# Patient Record
Sex: Male | Born: 1970 | Race: White | Hispanic: No | Marital: Married | Smoking: Never smoker
Health system: Southern US, Community
[De-identification: ages and names within clinical notes are randomized; demographics above are authoritative.]

## PROBLEM LIST (undated history)

## (undated) ENCOUNTER — Ambulatory Visit: Admission: EM | Payer: Self-pay | Source: Home / Self Care

## (undated) DIAGNOSIS — C801 Malignant (primary) neoplasm, unspecified: Secondary | ICD-10-CM

## (undated) DIAGNOSIS — I82890 Acute embolism and thrombosis of other specified veins: Secondary | ICD-10-CM

## (undated) DIAGNOSIS — G4733 Obstructive sleep apnea (adult) (pediatric): Secondary | ICD-10-CM

## (undated) DIAGNOSIS — N400 Enlarged prostate without lower urinary tract symptoms: Secondary | ICD-10-CM

## (undated) DIAGNOSIS — E291 Testicular hypofunction: Secondary | ICD-10-CM

## (undated) HISTORY — DX: Testicular hypofunction: E29.1

## (undated) HISTORY — DX: Benign prostatic hyperplasia without lower urinary tract symptoms: N40.0

## (undated) HISTORY — DX: Malignant (primary) neoplasm, unspecified: C80.1

## (undated) HISTORY — PX: TENOTOMY ACHILLES TENDON: SUR1337

## (undated) HISTORY — DX: Obstructive sleep apnea (adult) (pediatric): G47.33

---

## 2003-02-10 ENCOUNTER — Emergency Department (HOSPITAL_COMMUNITY): Admission: EM | Admit: 2003-02-10 | Discharge: 2003-02-10 | Payer: Self-pay | Admitting: Emergency Medicine

## 2012-01-23 DIAGNOSIS — G4733 Obstructive sleep apnea (adult) (pediatric): Secondary | ICD-10-CM

## 2012-01-23 HISTORY — PX: NASAL SEPTUM SURGERY: SHX37

## 2012-01-23 HISTORY — PX: HIP SURGERY: SHX245

## 2012-01-23 HISTORY — DX: Obstructive sleep apnea (adult) (pediatric): G47.33

## 2012-02-23 LAB — VITAMIN D 25 HYDROXY (VIT D DEFICIENCY, FRACTURES): Vit D, 25-Hydroxy: 21

## 2012-02-23 LAB — COMPREHENSIVE METABOLIC PANEL
ALT: 29
AST: 37 U/L
Alkaline Phosphatase: 54 U/L
BILIRUBIN: 1.1
BUN: 18 mg/dL (ref 4–21)
CREATININE: 0.98
Glucose: 92
Potassium: 4.3 mmol/L
SODIUM: 137 mmol/L (ref 137–147)

## 2012-02-23 LAB — CBC
HGB: 17 g/dL
PLATELET COUNT: 204
WBC: 7.1

## 2012-02-23 LAB — TSH+FREE T4
FREE T4: 0.9
TSH: 0.86

## 2012-02-23 LAB — LIPID PANEL
CHOLESTEROL, TOTAL: 146
HDL: 39 mg/dL (ref 35–70)
LDL (calc): 97
TRIGLYCERIDES: 52

## 2012-02-23 LAB — PSA: PSA: 0.59

## 2012-02-23 LAB — TESTOSTERONE: Testosterone: 186

## 2014-04-22 ENCOUNTER — Ambulatory Visit (INDEPENDENT_AMBULATORY_CARE_PROVIDER_SITE_OTHER): Payer: BLUE CROSS/BLUE SHIELD | Admitting: Family Medicine

## 2014-04-22 ENCOUNTER — Encounter: Payer: Self-pay | Admitting: Family Medicine

## 2014-04-22 VITALS — BP 110/76 | HR 48 | Temp 97.5°F | Ht 69.25 in | Wt 203.4 lb

## 2014-04-22 DIAGNOSIS — J342 Deviated nasal septum: Secondary | ICD-10-CM | POA: Insufficient documentation

## 2014-04-22 DIAGNOSIS — R413 Other amnesia: Secondary | ICD-10-CM

## 2014-04-22 DIAGNOSIS — R0789 Other chest pain: Secondary | ICD-10-CM | POA: Insufficient documentation

## 2014-04-22 DIAGNOSIS — G4733 Obstructive sleep apnea (adult) (pediatric): Secondary | ICD-10-CM

## 2014-04-22 DIAGNOSIS — R5382 Chronic fatigue, unspecified: Secondary | ICD-10-CM

## 2014-04-22 DIAGNOSIS — E291 Testicular hypofunction: Secondary | ICD-10-CM | POA: Insufficient documentation

## 2014-04-22 DIAGNOSIS — K625 Hemorrhage of anus and rectum: Secondary | ICD-10-CM | POA: Insufficient documentation

## 2014-04-22 DIAGNOSIS — N400 Enlarged prostate without lower urinary tract symptoms: Secondary | ICD-10-CM | POA: Insufficient documentation

## 2014-04-22 DIAGNOSIS — E663 Overweight: Secondary | ICD-10-CM | POA: Insufficient documentation

## 2014-04-22 HISTORY — DX: Testicular hypofunction: E29.1

## 2014-04-22 NOTE — Assessment & Plan Note (Signed)
H/o this in last month - will need further evaluation when returns for CPE. Has never had colonoscopy.

## 2014-04-22 NOTE — Assessment & Plan Note (Signed)
Check B12 and TSH today.

## 2014-04-22 NOTE — Assessment & Plan Note (Addendum)
Has f/u scheduled with Dr Annabell HowellsWrenn urology. Will request records from prior PCP

## 2014-04-22 NOTE — Assessment & Plan Note (Signed)
Overall atypical chest pain - not exertional.  Will check modifiable risk factors including glu and FLP. Check TnI today as well.  If persistent pain, consider referral for stress testing. No significant fmhx CAD  EKG today - NSR rate 60s, normal axis, intervals, no acute ST/T changes.

## 2014-04-22 NOTE — Assessment & Plan Note (Signed)
Sleep study positive but has not undergone treatment.

## 2014-04-22 NOTE — Assessment & Plan Note (Signed)
Motivated to increase exercise in regimen for goal sustainable weight loss

## 2014-04-22 NOTE — Progress Notes (Signed)
BP 110/76 mmHg  Pulse 48  Temp(Src) 97.5 F (36.4 C) (Oral)  Ht 5' 9.25" (1.759 m)  Wt 203 lb 6.4 oz (92.262 kg)  BMI 29.82 kg/m2   CC: new pt to establish  Subjective:    Patient ID: Adrian Moreno, male    DOB: [birth year], 44 y.o.   MRN: [id number]  HPI: Adrian Moreno is a 44 y.o. male presenting on 04/22/2014 for Establish Care   Prior saw Dr Leanora Ivanoff in [place], [place].   BPH (has seen Dr Annabell Howells) - on flomax daily as well as doxycycline  bid for I presume prostatitis. Has f/u with urology 05/03/2014.  Rectal bleeding with wiping - 1 mo ago, lasted a few days. No h/o hemorrhoids. No pain with defecation. Occasionally gets perianal rash and stays raw/irritated. Using towelettes to wipe.   Worsening memory recently - more trouble with short term recently noted. Some trouble with focus (missing exits).   Chest pain/tightness in the past month - several episodes. Come on at rest. Go away after a few min with resting and deep breathing. No dyspnea or nausea. No regular exercise. Wants to restart. Currently with mild chest pressure sensation. Intermittent GERD. Denies anxiety issues. No reproducible chest pain.   Hypogonadism - Testosterone low at 140 in the past. Has f/u with urology scheduled for this.   Nasal septal sinus surgery 2014 - done to treat OSA/snoring. Needed 2 surgeries on same day, rushed back for second surgery, needed cauterization of bleed. Since then, needs to use nasal saline daily, finds he easily retains fluid in nose. Feels there's a "shelf" superior nasal passage bilaterally. Snoring returning. Requests referral to ENT for second opinion/re evaluation.   OSA - could not tolerate CPAP so had repair of deviated septum. Has not had f/u sleep eval since then. Snoring has recurred.   Preventative: Due for CPE  Lives with wife, 1 dog Edu: some college Occupation: Retail banker at [employer] Activity: no regular exercise currently Diet: good water,  fruits/vegetables daily  Relevant past medical, surgical, family and social history reviewed and updated as indicated. Interim medical history since our last visit reviewed. Allergies and medications reviewed and updated. No current outpatient prescriptions on file prior to visit.   No current facility-administered medications on file prior to visit.    Review of Systems Per HPI unless specifically indicated above     Objective:    BP 110/76 mmHg  Pulse 48  Temp(Src) 97.5 F (36.4 C) (Oral)  Ht 5' 9.25" (1.759 m)  Wt 203 lb 6.4 oz (92.262 kg)  BMI 29.82 kg/m2  Wt Readings from Last 3 Encounters:  04/22/14 203 lb 6.4 oz (92.262 kg)    Physical Exam  Constitutional: He is oriented to person, place, and time. He appears well-developed and well-nourished. No distress.  HENT:  Head: Normocephalic and atraumatic.  Right Ear: Hearing, tympanic membrane, external ear and ear canal normal.  Left Ear: Hearing, tympanic membrane, external ear and ear canal normal.  Nose: Nose normal. No mucosal edema or rhinorrhea. Right sinus exhibits no maxillary sinus tenderness and no frontal sinus tenderness. Left sinus exhibits no maxillary sinus tenderness and no frontal sinus tenderness.  Mouth/Throat: Uvula is midline, oropharynx is clear and moist and mucous membranes are normal. No oropharyngeal exudate, posterior oropharyngeal edema, posterior oropharyngeal erythema or tonsillar abscesses.  Eyes: Conjunctivae and EOM are normal. Pupils are equal, round, and reactive to light. No scleral icterus.  Neck: Normal range of motion. Neck supple. No thyromegaly  present.  Cardiovascular: Normal rate, regular rhythm, normal heart sounds and intact distal pulses.   No murmur heard. Pulmonary/Chest: Effort normal and breath sounds normal. No respiratory distress. He has no wheezes. He has no rales. He exhibits no tenderness.  No reproducible chest pain to palpation  Musculoskeletal: He exhibits no edema.    Lymphadenopathy:    He has no cervical adenopathy.  Neurological: He is alert and oriented to person, place, and time.  Skin: Skin is warm and dry. No rash noted.  Psychiatric: He has a normal mood and affect.  Nursing note and vitals reviewed.  No results found for this or any previous visit.    Assessment & Plan:   Problem List Items Addressed This Visit    Rectal bleeding    H/o this in last month - will need further evaluation when returns for CPE. Has never had colonoscopy.      Overweight    Motivated to increase exercise in regimen for goal sustainable weight loss      OSA (obstructive sleep apnea)    Sleep study positive but has not undergone treatment.      Nasal septal deviation    Reports h/o deviated septum repair followed by nasal hemorrhage needing surgical revision and cauterization. Since surgery pt has noted difficulty with retention of fluid (ie nasal saline) and persistent nasal sinus congestion - worse than prior to surgery. Requests ENT referral for second opinion.      Memory loss    Check B12 and TSH today.      Relevant Orders   Vitamin B12   Hypogonadism in male    Has f/u scheduled with Dr Annabell HowellsWrenn urology. Will request records from prior PCP      Chest pressure - Primary    Overall atypical chest pain - not exertional.  Will check modifiable risk factors including glu and FLP. Check TnI today as well.  If persistent pain, consider referral for stress testing. No significant fmhx CAD  EKG today - NSR rate 60s, normal axis, intervals, no acute ST/T changes.      Relevant Orders   Lipid panel   Comprehensive metabolic panel   TSH   CBC with Differential/Platelet   EKG 12-Lead (Completed)   Troponin I   BPH (benign prostatic hypertrophy)    Has established with Dr Annabell HowellsWrenn urology. Currently on flomax and doxycycline - for presumed prostatitis.      Relevant Medications   tamsulosin (FLOMAX) 0.4 MG CAPS capsule    Other Visit Diagnoses     Chronic fatigue            Follow up plan: Return as needed, for CPE.

## 2014-04-22 NOTE — Assessment & Plan Note (Signed)
Has established with Dr Annabell HowellsWrenn urology. Currently on flomax and doxycycline - for presumed prostatitis.

## 2014-04-22 NOTE — Assessment & Plan Note (Signed)
Reports h/o deviated septum repair followed by nasal hemorrhage needing surgical revision and cauterization. Since surgery pt has noted difficulty with retention of fluid (ie nasal saline) and persistent nasal sinus congestion - worse than prior to surgery. Requests ENT referral for second opinion.

## 2014-04-22 NOTE — Patient Instructions (Addendum)
Labwork today.  EKG today.  Good to meet you today. Return at your convenience for physical.  We will refer you to ENT - we will call you in next few weeks to set this up. Have Dr Annabell HowellsWrenn send us note at next visit.

## 2014-04-23 LAB — CBC WITH DIFFERENTIAL/PLATELET
Basophils Absolute: 0 K/uL (ref 0.0–0.1)
Basophils Relative: 0.6 % (ref 0.0–3.0)
Eosinophils Absolute: 0.2 K/uL (ref 0.0–0.7)
Eosinophils Relative: 2.4 % (ref 0.0–5.0)
HCT: 48.7 % (ref 39.0–52.0)
Hemoglobin: 16.6 g/dL (ref 13.0–17.0)
Lymphocytes Relative: 42.8 % (ref 12.0–46.0)
Lymphs Abs: 3.1 K/uL (ref 0.7–4.0)
MCHC: 34.1 g/dL (ref 30.0–36.0)
MCV: 89.3 fl (ref 78.0–100.0)
Monocytes Absolute: 0.5 K/uL (ref 0.1–1.0)
Monocytes Relative: 6.3 % (ref 3.0–12.0)
Neutro Abs: 3.5 K/uL (ref 1.4–7.7)
Neutrophils Relative %: 47.9 % (ref 43.0–77.0)
Platelets: 176 K/uL (ref 150.0–400.0)
RBC: 5.46 Mil/uL (ref 4.22–5.81)
RDW: 14.3 % (ref 11.5–15.5)
WBC: 7.3 K/uL (ref 4.0–10.5)

## 2014-04-23 LAB — TROPONIN I: Troponin I: <0.01 ng/mL

## 2014-04-23 LAB — COMPREHENSIVE METABOLIC PANEL WITH GFR
ALT: 29 U/L (ref 0–53)
AST: 22 U/L (ref 0–37)
Albumin: 4.4 g/dL (ref 3.5–5.2)
Alkaline Phosphatase: 56 U/L (ref 39–117)
BUN: 12 mg/dL (ref 6–23)
CO2: 31 meq/L (ref 19–32)
Calcium: 10 mg/dL (ref 8.4–10.5)
Chloride: 103 meq/L (ref 96–112)
Creatinine, Ser: 0.92 mg/dL (ref 0.40–1.50)
GFR: 94.64 mL/min
Glucose, Bld: 84 mg/dL (ref 70–99)
Potassium: 4.2 meq/L (ref 3.5–5.1)
Sodium: 138 meq/L (ref 135–145)
Total Bilirubin: 1 mg/dL (ref 0.2–1.2)
Total Protein: 7.1 g/dL (ref 6.0–8.3)

## 2014-04-23 LAB — LIPID PANEL
CHOLESTEROL: 174 mg/dL (ref 0–200)
HDL: 43.4 mg/dL (ref >39.00)
LDL CALC: 95 mg/dL (ref 0–99)
NonHDL: 130.6
TRIGLYCERIDES: 176 mg/dL — AB (ref 0.0–149.0)
Total CHOL/HDL Ratio: 4
VLDL: 35.2 mg/dL (ref 0.0–40.0)

## 2014-04-23 LAB — VITAMIN B12: Vitamin B-12: 262 pg/mL (ref 211–911)

## 2014-04-23 LAB — TSH: TSH: 1.39 u[IU]/mL (ref 0.35–4.50)

## 2014-04-25 ENCOUNTER — Other Ambulatory Visit: Payer: Self-pay | Admitting: Family Medicine

## 2014-04-25 DIAGNOSIS — E538 Deficiency of other specified B group vitamins: Secondary | ICD-10-CM | POA: Insufficient documentation

## 2014-04-25 MED ORDER — VITAMIN B-12 1000 MCG PO TABS: 1000.0000 ug | ORAL_TABLET | Freq: Every day | ORAL | Status: DC

## 2014-04-26 ENCOUNTER — Encounter: Payer: Self-pay | Admitting: *Deleted

## 2014-05-07 ENCOUNTER — Encounter: Payer: Self-pay | Admitting: Family Medicine

## 2014-05-17 ENCOUNTER — Encounter: Payer: Self-pay | Admitting: *Deleted

## 2014-05-23 HISTORY — PX: COLONOSCOPY: SHX174

## 2014-06-03 LAB — HM COLONOSCOPY

## 2014-06-04 ENCOUNTER — Encounter: Payer: Self-pay | Admitting: Family Medicine

## 2014-07-08 ENCOUNTER — Encounter: Payer: Self-pay | Admitting: Family Medicine

## 2014-07-08 ENCOUNTER — Ambulatory Visit (INDEPENDENT_AMBULATORY_CARE_PROVIDER_SITE_OTHER): Payer: BLUE CROSS/BLUE SHIELD | Admitting: Family Medicine

## 2014-07-08 VITALS — BP 122/78 | HR 84 | Temp 98.2°F | Ht 69.25 in | Wt 204.5 lb

## 2014-07-08 DIAGNOSIS — E538 Deficiency of other specified B group vitamins: Secondary | ICD-10-CM | POA: Diagnosis not present

## 2014-07-08 DIAGNOSIS — S76311A Strain of muscle, fascia and tendon of the posterior muscle group at thigh level, right thigh, initial encounter: Secondary | ICD-10-CM

## 2014-07-08 DIAGNOSIS — R0789 Other chest pain: Secondary | ICD-10-CM | POA: Diagnosis not present

## 2014-07-08 DIAGNOSIS — E291 Testicular hypofunction: Secondary | ICD-10-CM | POA: Diagnosis not present

## 2014-07-08 DIAGNOSIS — Z0000 Encounter for general adult medical examination without abnormal findings: Secondary | ICD-10-CM | POA: Insufficient documentation

## 2014-07-08 DIAGNOSIS — S76319A Strain of muscle, fascia and tendon of the posterior muscle group at thigh level, unspecified thigh, initial encounter: Secondary | ICD-10-CM | POA: Insufficient documentation

## 2014-07-08 NOTE — Assessment & Plan Note (Addendum)
Anticipate anxiety related - but given persistent sxs will refer to cardiology to help r/o cardiac cause, consider ETT. Pt agrees.

## 2014-07-08 NOTE — Assessment & Plan Note (Signed)
Has established with urology - on testosterone injections. Will refer to card for clearance prior to continued increased testosterone dose.

## 2014-07-08 NOTE — Assessment & Plan Note (Signed)
Anticipate hamstring strain - treat supportively with stretching exercises provided today as well as ace bandage for compression. Advised caution with increased activity too quickly.

## 2014-07-08 NOTE — Patient Instructions (Addendum)
We will refer you to heart doctor for further evaluation of chest pain and likely treadmill stress test. Return as needed or in 1 year for next physical. Sign release for records from Dr Annabell Howells. For hamstring - likely strain. Treat with exercises provided and use ace bandage for compression wrap for thigh especially if you are active.

## 2014-07-08 NOTE — Assessment & Plan Note (Signed)
Preventative protocols reviewed and updated unless pt declined. Discussed healthy diet and lifestyle.  

## 2014-07-08 NOTE — Progress Notes (Signed)
BP 122/78 mmHg  Pulse 84  Temp(Src) 98.2 F (36.8 C) (Oral)  Ht 5' 9.25" (1.759 m)  Wt 204 lb 8 oz (92.761 kg)  BMI 29.98 kg/m2   CC: CPE  Subjective:    Patient ID: Adrian Moreno, male    DOB: 1970-12-26, 44 y.o.   MRN: [id number]  HPI: Adrian Moreno is a 44 y.o. male presenting on 07/08/2014 for Annual Exam   Urologist Dr Annabell Howells - started on testosterone without significant improvement. Stable prostate exam. ?BPH in the past. Did not like flomax.  R hamstring tightness over last month. Persistent discomfort. Has treated with   See prior note for details. Persistent intermittent chest pains improved with deep breathing. Discomfort described as tightness. + mild dyspnea. No nausea or jaw/L arm pain. Attributes to anxiety. Sedentary. Saturday going mtn bike riding. Recent weight gain. Finds alcohol completely relieves pain.  Margarita drinks cause severe heartburn.  Preventative: Colonoscopy 05/2014 - int hemorrhoids otherwise normal (Dr Madilyn Fireman) Flu - declines Tetanus ~2012 Seat belt use discussed Sunscreen use discussed. No changing moles on skin  Lives with wife, 1 dog Edu: some college Occupation: Retail banker at [employer] Activity: no regular exercise currently  Diet: good water, fruits/vegetables daily, avoiding fast foods  Relevant past medical, surgical, family and social history reviewed and updated as indicated. Interim medical history since our last visit reviewed. Allergies and medications reviewed and updated. Current Outpatient Prescriptions on File Prior to Visit  Medication Sig  . vitamin B-12 (CYANOCOBALAMIN) 1000 MCG tablet Take 1 tablet (1,000 mcg total) by mouth daily.   No current facility-administered medications on file prior to visit.    Review of Systems  Constitutional: Negative for fever, chills, activity change, appetite change, fatigue and unexpected weight change.  HENT: Negative for hearing loss.   Eyes: Negative for visual disturbance.    Respiratory: Negative for cough, chest tightness, shortness of breath and wheezing.   Cardiovascular: Positive for chest pain (intermittent, related to stress/nerves). Negative for palpitations and leg swelling.  Gastrointestinal: Negative for nausea, vomiting, abdominal pain, diarrhea, constipation, blood in stool and abdominal distention.  Genitourinary: Negative for hematuria and difficulty urinating.  Musculoskeletal: Negative for myalgias, arthralgias and neck pain.  Skin: Negative for rash.  Neurological: Negative for dizziness, seizures, syncope and headaches.  Hematological: Negative for adenopathy. Does not bruise/bleed easily.  Psychiatric/Behavioral: Negative for dysphoric mood. The patient is nervous/anxious.    Per HPI unless specifically indicated above     Objective:    BP 122/78 mmHg  Pulse 84  Temp(Src) 98.2 F (36.8 C) (Oral)  Ht 5' 9.25" (1.759 m)  Wt 204 lb 8 oz (92.761 kg)  BMI 29.98 kg/m2  Wt Readings from Last 3 Encounters:  07/08/14 204 lb 8 oz (92.761 kg)  04/22/14 203 lb 6.4 oz (92.262 kg)    Physical Exam  Constitutional: He is oriented to person, place, and time. He appears well-developed and well-nourished. No distress.  HENT:  Head: Normocephalic and atraumatic.  Right Ear: Hearing, tympanic membrane, external ear and ear canal normal.  Left Ear: Hearing, tympanic membrane, external ear and ear canal normal.  Nose: Nose normal.  Mouth/Throat: Uvula is midline, oropharynx is clear and moist and mucous membranes are normal. No oropharyngeal exudate, posterior oropharyngeal edema or posterior oropharyngeal erythema.  Eyes: Conjunctivae and EOM are normal. Pupils are equal, round, and reactive to light. No scleral icterus.  Neck: Normal range of motion. Neck supple. No thyromegaly present.  Cardiovascular: Normal rate, regular rhythm,  normal heart sounds and intact distal pulses.   No murmur heard. Pulses:      Radial pulses are 2+ on the right side,  and 2+ on the left side.  Pulmonary/Chest: Effort normal and breath sounds normal. No respiratory distress. He has no wheezes. He has no rales. He exhibits no tenderness.  Abdominal: Soft. Bowel sounds are normal. He exhibits no distension and no mass. There is no tenderness. There is no rebound and no guarding.  Musculoskeletal: Normal range of motion. He exhibits no edema.  Lymphadenopathy:    He has no cervical adenopathy.  Neurological: He is alert and oriented to person, place, and time.  CN grossly intact, station and gait intact  Skin: Skin is warm and dry. No rash noted.  Psychiatric: He has a normal mood and affect. His behavior is normal. Judgment and thought content normal.  Nursing note and vitals reviewed.  Results for orders placed or performed in visit on 06/04/14  HM COLONOSCOPY  Result Value Ref Range   HM Colonoscopy int hem, o/w WNL       Assessment & Plan:   Problem List Items Addressed This Visit    Chest pressure    Anticipate anxiety related - but given persistent sxs will refer to cardiology to help r/o cardiac cause, consider ETT. Pt agrees.      Relevant Orders   Ambulatory referral to Cardiology   Hamstring strain    Anticipate hamstring strain - treat supportively with stretching exercises provided today as well as ace bandage for compression. Advised caution with increased activity too quickly.      Health maintenance examination - Primary    Preventative protocols reviewed and updated unless pt declined. Discussed healthy diet and lifestyle.       Hypogonadism in male    Has established with urology - on testosterone injections. Will refer to card for clearance prior to continued increased testosterone dose.      Low vitamin B12 level    Pt compliant with 1000 mcg daily OTC          Follow up plan: Return in about 1 year (around 07/08/2015), or as needed, for annual exam, prior fasting for blood work.

## 2014-07-08 NOTE — Progress Notes (Signed)
Pre visit review using our clinic review tool, if applicable. No additional management support is needed unless otherwise documented below in the visit note. 

## 2014-07-08 NOTE — Assessment & Plan Note (Signed)
Pt compliant with 1000 mcg daily OTC

## 2014-08-12 ENCOUNTER — Ambulatory Visit: Payer: BLUE CROSS/BLUE SHIELD | Admitting: Cardiovascular Disease

## 2014-08-27 ENCOUNTER — Ambulatory Visit: Payer: BLUE CROSS/BLUE SHIELD | Admitting: Cardiovascular Disease

## 2014-09-17 ENCOUNTER — Encounter: Payer: Self-pay | Admitting: Podiatry

## 2014-09-17 ENCOUNTER — Ambulatory Visit (INDEPENDENT_AMBULATORY_CARE_PROVIDER_SITE_OTHER): Payer: BLUE CROSS/BLUE SHIELD

## 2014-09-17 ENCOUNTER — Ambulatory Visit (INDEPENDENT_AMBULATORY_CARE_PROVIDER_SITE_OTHER): Payer: BLUE CROSS/BLUE SHIELD | Admitting: Podiatry

## 2014-09-17 VITALS — BP 120/76 | HR 83 | Resp 16 | Ht 70.0 in | Wt 200.0 lb

## 2014-09-17 DIAGNOSIS — M722 Plantar fascial fibromatosis: Secondary | ICD-10-CM | POA: Diagnosis not present

## 2014-09-17 NOTE — Progress Notes (Signed)
   Subjective:    Patient ID: Adrian Moreno, male    DOB: August 23, 1970, 44 y.o.   MRN: [id number]  HPI Comments: "My heels have been killing me"  Patient presents with bilateral foot pain in the left fourth met right. He states he's had pain for last 2-3 months and his been intermittent in nature but progressing. He states he is taking ibuprofen which seems to help some. He has pain in the morning or after periods of inactivity which is relieved by ambulation. He states the area feels tight. Denies any recent injury or trauma. No tingling or numbness. No swelling or redness. The pain does not wake him up at night. No other complaints at this time.      Review of Systems  All other systems reviewed and are negative.      Objective:   Physical Exam AAO x3, NAD DP/PT pulses palpable bilaterally, CRT less than 3 seconds Protective sensation intact with Simms Weinstein monofilament, vibratory sensation intact, Achilles tendon reflex intact Tenderness to palpation overlying the plantar medial tubercle of the calcaneus to bilateral heel at the insertion of the plantar fascia with the left > right. There is no pain along the course of plantar fascial within the arch of the foot. There is no pain with lateral compression of the calcaneus or pain the vibratory sensation. No pain on the posterior aspect of the calcaneus or along the course/insertion of the Achilles tendon. There is no overlying edema, erythema, increase in warmth. No other areas of tenderness palpation or pain with vibratory sensation to the foot/ankle. MMT 5/5, ROM WNL No open lesions or pre-ulcerative lesions are identified. No pain with calf compression, swelling, warmth, erythema.      Assessment & Plan:  44 year old male with bilateral heel pain left greater than right. -Treatment options discussed including all alternatives, risks, and complications -X-rays were obtained and reviewed with the patient.  Patient elects to  proceed with steroid injection into the bilateral heel. Under sterile skin preparation, a total of 2.5cc of kenalog 10, 0.5% Marcaine plain, and 2% lidocaine plain were infiltrated into the symptomatic area without complication. A band-aid was applied. Patient tolerated the injection well without complication. Post-injection care with discussed with the patient. Discussed with the patient to ice the area over the next couple of days to help prevent a steroid flare.  -Fascial brace dispensed. -Stretching exercises daily ice daily. -Shoe gear modifications. -Discussed orthotics. -Follow-up 3-4 weeks or sooner if any problems arise. In the meantime, encouraged to call the office with any questions, concerns, change in symptoms.   Celesta Gentile, DPM

## 2014-09-17 NOTE — Patient Instructions (Signed)

## 2014-10-08 ENCOUNTER — Ambulatory Visit: Payer: BLUE CROSS/BLUE SHIELD | Admitting: Podiatry

## 2014-10-08 ENCOUNTER — Ambulatory Visit (INDEPENDENT_AMBULATORY_CARE_PROVIDER_SITE_OTHER): Payer: BLUE CROSS/BLUE SHIELD | Admitting: Podiatry

## 2014-10-08 ENCOUNTER — Encounter: Payer: Self-pay | Admitting: Podiatry

## 2014-10-08 ENCOUNTER — Telehealth: Payer: Self-pay | Admitting: *Deleted

## 2014-10-08 VITALS — BP 100/75 | HR 81 | Resp 18

## 2014-10-08 DIAGNOSIS — M722 Plantar fascial fibromatosis: Secondary | ICD-10-CM

## 2014-10-08 MED ORDER — METHYLPREDNISOLONE 4 MG PO TBPK: ORAL_TABLET | ORAL | Status: DC

## 2014-10-08 NOTE — Patient Instructions (Signed)
Plantar Fasciitis (Heel Spur Syndrome) with Rehab The plantar fascia is a fibrous, ligament-like, soft-tissue structure that spans the bottom of the foot. Plantar fasciitis is a condition that causes pain in the foot due to inflammation of the tissue. SYMPTOMS   Pain and tenderness on the underneath side of the foot.  Pain that worsens with standing or walking. CAUSES  Plantar fasciitis is caused by irritation and injury to the plantar fascia on the underneath side of the foot. Common mechanisms of injury include:  Direct trauma to bottom of the foot.  Damage to a small nerve that runs under the foot where the main fascia attaches to the heel bone.  Stress placed on the plantar fascia due to bone spurs. RISK INCREASES WITH:   Activities that place stress on the plantar fascia (running, jumping, pivoting, or cutting).  Poor strength and flexibility.  Improperly fitted shoes.  Tight calf muscles.  Flat feet.  Failure to warm-up properly before activity.  Obesity. PREVENTION  Warm up and stretch properly before activity.  Allow for adequate recovery between workouts.  Maintain physical fitness:  Strength, flexibility, and endurance.  Cardiovascular fitness.  Maintain a health body weight.  Avoid stress on the plantar fascia.  Wear properly fitted shoes, including arch supports for individuals who have flat feet. PROGNOSIS  If treated properly, then the symptoms of plantar fasciitis usually resolve without surgery. However, occasionally surgery is necessary. RELATED COMPLICATIONS   Recurrent symptoms that may result in a chronic condition.  Problems of the lower back that are caused by compensating for the injury, such as limping.  Pain or weakness of the foot during push-off following surgery.  Chronic inflammation, scarring, and partial or complete fascia tear, occurring more often from repeated injections. TREATMENT  Treatment initially involves the use of  ice and medication to help reduce pain and inflammation. The use of strengthening and stretching exercises may help reduce pain with activity, especially stretches of the Achilles tendon. These exercises may be performed at home or with a therapist. Your caregiver may recommend that you use heel cups of arch supports to help reduce stress on the plantar fascia. Occasionally, corticosteroid injections are given to reduce inflammation. If symptoms persist for greater than 6 months despite non-surgical (conservative), then surgery may be recommended.  MEDICATION   If pain medication is necessary, then nonsteroidal anti-inflammatory medications, such as aspirin and ibuprofen, or other minor pain relievers, such as acetaminophen, are often recommended.  Do not take pain medication within 7 days before surgery.  Prescription pain relievers may be given if deemed necessary by your caregiver. Use only as directed and only as much as you need.  Corticosteroid injections may be given by your caregiver. These injections should be reserved for the most serious cases, because they may only be given a certain number of times. HEAT AND COLD  Cold treatment (icing) relieves pain and reduces inflammation. Cold treatment should be applied for 10 to 15 minutes every 2 to 3 hours for inflammation and pain and immediately after any activity that aggravates your symptoms. Use ice packs or massage the area with a piece of ice (ice massage).  Heat treatment may be used prior to performing the stretching and strengthening activities prescribed by your caregiver, physical therapist, or athletic trainer. Use a heat pack or soak the injury in warm water. SEEK IMMEDIATE MEDICAL CARE IF:  Treatment seems to offer no benefit, or the condition worsens.  Any medications produce adverse side effects. EXERCISES RANGE   OF MOTION (ROM) AND STRETCHING EXERCISES - Plantar Fasciitis (Heel Spur Syndrome) These exercises may help you  when beginning to rehabilitate your injury. Your symptoms may resolve with or without further involvement from your physician, physical therapist or athletic trainer. While completing these exercises, remember:   Restoring tissue flexibility helps normal motion to return to the joints. This allows healthier, less painful movement and activity.  An effective stretch should be held for at least 30 seconds.  A stretch should never be painful. You should only feel a gentle lengthening or release in the stretched tissue. RANGE OF MOTION - Toe Extension, Flexion  Sit with your right / left leg crossed over your opposite knee.  Grasp your toes and gently pull them back toward the top of your foot. You should feel a stretch on the bottom of your toes and/or foot.  Hold this stretch for __________ seconds.  Now, gently pull your toes toward the bottom of your foot. You should feel a stretch on the top of your toes and or foot.  Hold this stretch for __________ seconds. Repeat __________ times. Complete this stretch __________ times per day.  RANGE OF MOTION - Ankle Dorsiflexion, Active Assisted  Remove shoes and sit on a chair that is preferably not on a carpeted surface.  Place right / left foot under knee. Extend your opposite leg for support.  Keeping your heel down, slide your right / left foot back toward the chair until you feel a stretch at your ankle or calf. If you do not feel a stretch, slide your bottom forward to the edge of the chair, while still keeping your heel down.  Hold this stretch for __________ seconds. Repeat __________ times. Complete this stretch __________ times per day.  STRETCH - Gastroc, Standing  Place hands on wall.  Extend right / left leg, keeping the front knee somewhat bent.  Slightly point your toes inward on your back foot.  Keeping your right / left heel on the floor and your knee straight, shift your weight toward the wall, not allowing your back to  arch.  You should feel a gentle stretch in the right / left calf. Hold this position for __________ seconds. Repeat __________ times. Complete this stretch __________ times per day. STRETCH - Soleus, Standing  Place hands on wall.  Extend right / left leg, keeping the other knee somewhat bent.  Slightly point your toes inward on your back foot.  Keep your right / left heel on the floor, bend your back knee, and slightly shift your weight over the back leg so that you feel a gentle stretch deep in your back calf.  Hold this position for __________ seconds. Repeat __________ times. Complete this stretch __________ times per day. STRETCH - Gastrocsoleus, Standing  Note: This exercise can place a lot of stress on your foot and ankle. Please complete this exercise only if specifically instructed by your caregiver.   Place the ball of your right / left foot on a step, keeping your other foot firmly on the same step.  Hold on to the wall or a rail for balance.  Slowly lift your other foot, allowing your body weight to press your heel down over the edge of the step.  You should feel a stretch in your right / left calf.  Hold this position for __________ seconds.  Repeat this exercise with a slight bend in your right / left knee. Repeat __________ times. Complete this stretch __________ times per day.    STRENGTHENING EXERCISES - Plantar Fasciitis (Heel Spur Syndrome)  These exercises may help you when beginning to rehabilitate your injury. They may resolve your symptoms with or without further involvement from your physician, physical therapist or athletic trainer. While completing these exercises, remember:   Muscles can gain both the endurance and the strength needed for everyday activities through controlled exercises.  Complete these exercises as instructed by your physician, physical therapist or athletic trainer. Progress the resistance and repetitions only as guided. STRENGTH -  Towel Curls  Sit in a chair positioned on a non-carpeted surface.  Place your foot on a towel, keeping your heel on the floor.  Pull the towel toward your heel by only curling your toes. Keep your heel on the floor.  If instructed by your physician, physical therapist or athletic trainer, add ____________________ at the end of the towel. Repeat __________ times. Complete this exercise __________ times per day. STRENGTH - Ankle Inversion  Secure one end of a rubber exercise band/tubing to a fixed object (table, pole). Loop the other end around your foot just before your toes.  Place your fists between your knees. This will focus your strengthening at your ankle.  Slowly, pull your big toe up and in, making sure the band/tubing is positioned to resist the entire motion.  Hold this position for __________ seconds.  Have your muscles resist the band/tubing as it slowly pulls your foot back to the starting position. Repeat __________ times. Complete this exercises __________ times per day.  Document Released: 01/08/2005 Document Revised: 04/02/2011 Document Reviewed: 04/22/2008 [employer] Patient Information 2015 [employer]. This information is not intended to replace advice given to you by your health care provider. Make sure you discuss any questions you have with your health care provider.  

## 2014-10-08 NOTE — Telephone Encounter (Addendum)
MRI left foot without contrast faxed to [facility].  [employer] Authorization# [id number], valid to 11/09/2014.  Faxed.

## 2014-10-11 ENCOUNTER — Encounter: Payer: Self-pay | Admitting: Podiatry

## 2014-10-11 NOTE — Progress Notes (Signed)
Patient ID: Adrian Moreno, male   DOB: 17-Oct-1970, 44 y.o.   MRN: [id number]  Subjective: 44 year old male presents the office today for follow-up evaluation of bilateral heel pain. He states that since last upon the left foot is worsening. He does that he gets some relief after the injections however after couple weeks but did not last he started to have recurrence of pain. He states the left foot is are sent for tissue and weightbearing. He also has pain in the morning he first gets up. Denies any swelling or redness. No tingling or numbness. There is been no recent injury or trauma. He is also continue the stretching, icing. He states the plantar fascial brace did not help. No other complaints at this time in no acute changes. He denies any systemic complaints as fevers, chills, nausea, vomiting. No calf pain, chest pain, shortness of breath.  Objective: AAO x3, NAD DP/PT pulses palpable bilaterally, CRT less than 3 seconds Protective sensation intact with Simms Weinstein monofilament, vibratory sensation intact, Achilles tendon reflex intact; negative Tinel sign. There is continued tenderness to palpation overlying the plantar medial tubercle of the calcaneus to both the left and right heel at the insertion of the plantar fascia with the left more significant than the right. There is no pain along the course of plantar fascial within the arch of the foot. The plantar fasciitis appears to be intact. There is no pain with lateral compression of the calcaneus or pain the vibratory sensation. No pain on the posterior aspect of the calcaneus or along the course/insertion of the Achilles tendon. There is no overlying edema, erythema, increase in warmth. No other areas of tenderness palpation or pain with vibratory sensation to the foot/ankle. No open lesions or pre-ulcerative lesions are identified. No pain with calf compression, swelling, warmth, erythema.  Assessment: 44 year old male with continued  bilateral heel pain left greater than right.  Plan: -Treatment options discussed including all alternatives, risks, and complications -Patient elects to proceed with steroid injection into the bilateral heels. Under sterile skin preparation, a total of 2.5cc of kenalog 10, 0.5% Marcaine plain, and 2% lidocaine plain were infiltrated into the symptomatic area without complication. A band-aid was applied. Patient tolerated the injection well without complication. Post-injection care with discussed with the patient. Discussed with the patient to ice the area over the next couple of days to help prevent a steroid flare.  -Continue icing, stretching exercises daily. Continue supportive shoe gear and orthotics were discussed. -Due to the increased pain to the left chills last appointment concern for plantar fascial tear. Because of this an MRI was ordered of the left foot. -Prescription for physical therapy was also provided today. -Follow-up in 3 weeks or sooner if any problems arise. In the meantime, encouraged to call the office with any questions, concerns, change in symptoms.   Ovid Curd, DPM

## 2014-10-18 ENCOUNTER — Other Ambulatory Visit: Payer: BLUE CROSS/BLUE SHIELD

## 2014-10-29 ENCOUNTER — Encounter: Payer: Self-pay | Admitting: Podiatry

## 2014-10-29 ENCOUNTER — Ambulatory Visit (INDEPENDENT_AMBULATORY_CARE_PROVIDER_SITE_OTHER): Payer: BLUE CROSS/BLUE SHIELD | Admitting: Podiatry

## 2014-10-29 VITALS — BP 106/58 | HR 75 | Resp 18

## 2014-10-29 DIAGNOSIS — M722 Plantar fascial fibromatosis: Secondary | ICD-10-CM

## 2014-10-29 MED ORDER — MELOXICAM 7.5 MG PO TABS: 7.5000 mg | ORAL_TABLET | Freq: Every day | ORAL | Status: DC

## 2014-10-29 NOTE — Patient Instructions (Signed)

## 2014-11-01 ENCOUNTER — Encounter: Payer: Self-pay | Admitting: Podiatry

## 2014-11-01 NOTE — Progress Notes (Signed)
Patient ID: Adrian Moreno, male   DOB: Oct 24, 1970, 44 y.o.   MRN: [id number]  Subjective:  44 year old male presents the office they for follow-up evaluation of bilateral heel pain. He states the right side is almost 100% better. He states the left pain has decreased. He no longer has a first step pain in the morning and the left side although he does continue to have pain to the heel with weightbearing and pressure for prolonged. A time. He states that the removable plantar fascial pads which are dispensed last appointment helped significantly. He states that almost immediately he had resolution of symptoms after wearing these pads. He is continuing stretching icing activities as well. He was able to get the MRI due to insurance. He did not follow-up with physical therapy. No other complaints at the time in no acute changes otherwise. He denies any systemic complaints such as fevers, chills, nausea, vomiting. No calf pain, chest pain, SOB.  Objective: AAO x3, NAD DP/PT pulses palpable bilaterally, CRT less than 3 seconds Protective sensation intact with Simms Weinstein monofilament, vibratory sensation intact, Achilles tendon reflex intact There is mild continued tenderness to palpation overlying the plantar medial tubercle of the calcaneus to the left heel heel at the insertion of the plantar fascia. There is no significant pain in the right heel. There is no pain along the course of plantar fascia within the arch of the foot. The plantar fascia appears to be intact. There is no pain with lateral compression of the calcaneus or pain the vibratory sensation. No pain on the posterior aspect of the calcaneus or along the course/insertion of the Achilles tendon. There is no overlying edema, erythema, increase in warmth. No other areas of tenderness palpation or pain with vibratory sensation to the foot/ankle. MMT 5/5, ROM WNL No open lesions or pre-ulcerative lesions are identified. No pain with calf  compression, swelling, warmth, erythema.  Assessment: 44 year old male with continuation left heel pain with resolving right heel pain.  Plan: -Treatment options discussed including all alternatives, risks, and complications -Prescribed mobic. Discussed side effects of the medication and directed to stop if any are to occur and call the office.  -Continue icing and stretching exercises daily. -New plantar fascial pads were applied/dispensed. -I do recommend custom orthotics. States appointment he was scanned for orthotics never sent to [facility]. -Continue supportive shoe gear. -Discussed steroid injection to the left heel however he wishes to hold off on that this time. -Follow-up once orthotics arrive or sooner if any problems are to arise. In the meantime call the office with any questions, concerns, change in symptoms.  Ovid Curd, DPM

## 2014-11-19 ENCOUNTER — Ambulatory Visit (INDEPENDENT_AMBULATORY_CARE_PROVIDER_SITE_OTHER): Payer: BLUE CROSS/BLUE SHIELD | Admitting: Podiatry

## 2014-11-19 ENCOUNTER — Encounter: Payer: Self-pay | Admitting: Podiatry

## 2014-11-19 VITALS — BP 160/100 | HR 69 | Resp 12

## 2014-11-19 DIAGNOSIS — M722 Plantar fascial fibromatosis: Secondary | ICD-10-CM | POA: Diagnosis not present

## 2014-11-22 ENCOUNTER — Telehealth: Payer: Self-pay | Admitting: *Deleted

## 2014-11-22 ENCOUNTER — Encounter: Payer: Self-pay | Admitting: Podiatry

## 2014-11-22 DIAGNOSIS — M722 Plantar fascial fibromatosis: Secondary | ICD-10-CM

## 2014-11-22 NOTE — Progress Notes (Signed)
Patient ID: Dena BilletJeffrey Balbi, male   DOB: 06/12/1970, 44 y.o.   MRN: [id number]  Subjective: 44 year old male presents the office today for follow-up evaluation of bilateral heel pain. He states the right heel pain is completely resolved. He does continue to have pain to the left side which is limiting his daily activities. Her last appointment he was doing well. He feels that he has taken a step backwards since last appointment to the left side. He denies any recent injury or trauma to the area. He believes that this pain and left-sided man started after he underwent left hip surgery last year and he was on this foot for quite some time he has become more active. He denies any numbness or tingling. He denies any swelling or redness. The pain does not wake him up at night. He continues his stretching, icing exercises daily. He is continue supportive shoe gear. He is also taking up orthotics today. No other complaints at this time no acute changes.  Objective: AAO 3, NAD DP/PT pulses palpable 2/4, CRT less than 3 seconds Protective sensation intact with Simms Weinstein monofilament  There is tenderness to palpation on the plantar medial tubercle of the calcaneus at the insertion the plantar fascia on the left side. There is mild discomfort on the medial band of the plantar fascial in the arch of the foot. Plantar fascia appears to be grossly intact. There is no pain with lateral compression of the calcaneus or pain the vibratory sensation. No pain along the course the Achilles tendon. Equinus is present. No other areas of tenderness to bilateral lower extremities. There is no overlying edema, erythema, increase in warmth. No open lesions or pre-ulcerative lesions. No pain with calf compression, swelling, warmth, erythema.  Objective: 44 year old male presents with bilateral heel pain left worse than right  Plan: -Treatment options discussed including all alternatives, risks, and complications -Patient  elects to proceed with steroid injection into the left heel. Under sterile skin preparation, a total of 2.5cc of kenalog 10, 0.5% Marcaine plain, and 2% lidocaine plain were infiltrated into the symptomatic area without complication. A band-aid was applied. Patient tolerated the injection well without complication. Post-injection care with discussed with the patient. Discussed with the patient to ice the area over the next couple of days to help prevent a steroid flare.  -At today's appointment orthotics were dispensed. They appear to be fitting well. Break-in instructions both oral and written were provided. -Continue ice and stretching exercises daily. -I discussed physical therapy however due to time he did not go. -He would like to pursue the MRI the left side at this time. I ordered 1 previously however he declined it as she was doing better. I will reorder this today. -Follow-up after MRI or sooner if any problems arise. In the meantime, encouraged to call the office with any questions, concerns, change in symptoms.   Ovid CurdMatthew Arushi Partridge, DPM

## 2014-11-22 NOTE — Telephone Encounter (Addendum)
At 11/19/2014 pt states he has now met his insurance deductible and would like to get the MRI of his left foot.  Dr. Jacqualyn Posey ordered MRI left foot plantar fasciitis chronic.  Prior Authorization [employer] denied, must have DR. Wagoner contact reviewer at [phone] to provide additional clinical information.  Dr. Leigh Aurora PRIOR AUTHORIZATION # [id number] valid to 12/22/2014.  Faxed to [place] [phone].

## 2014-11-23 NOTE — Telephone Encounter (Signed)
Authorization #[id number] valid until 12/22/14.

## 2014-11-26 ENCOUNTER — Ambulatory Visit (INDEPENDENT_AMBULATORY_CARE_PROVIDER_SITE_OTHER): Payer: BLUE CROSS/BLUE SHIELD | Admitting: Urology

## 2014-11-26 DIAGNOSIS — E291 Testicular hypofunction: Secondary | ICD-10-CM

## 2014-11-26 DIAGNOSIS — R351 Nocturia: Secondary | ICD-10-CM | POA: Diagnosis not present

## 2014-11-26 DIAGNOSIS — N401 Enlarged prostate with lower urinary tract symptoms: Secondary | ICD-10-CM

## 2014-11-26 DIAGNOSIS — D751 Secondary polycythemia: Secondary | ICD-10-CM | POA: Diagnosis not present

## 2014-11-30 ENCOUNTER — Ambulatory Visit
Admission: RE | Admit: 2014-11-30 | Discharge: 2014-11-30 | Disposition: A | Discharge status: Home / Self Care | Payer: BLUE CROSS/BLUE SHIELD | Source: Ambulatory Visit | Attending: Podiatry | Admitting: Podiatry

## 2014-11-30 DIAGNOSIS — M722 Plantar fascial fibromatosis: Secondary | ICD-10-CM

## 2014-12-09 ENCOUNTER — Telehealth: Payer: Self-pay | Admitting: *Deleted

## 2014-12-09 DIAGNOSIS — M722 Plantar fascial fibromatosis: Secondary | ICD-10-CM | POA: Diagnosis not present

## 2014-12-09 NOTE — Telephone Encounter (Signed)
Called patient on Tuesday Dec 03, 2014 and left a message for the patient to call me back to go over the MRI of his left heel. Patient called back and I stated to the patient what Dr Ardelle AntonWagoner had seen on the report and patient states he is still hurting and per Dr Ardelle AntonWagoner could fit him in an AFW and [facility] [place] [facility] to get a boot and to be there around 4:15 and the patient agreed. Misty StanleyLisa

## 2014-12-09 NOTE — Telephone Encounter (Signed)
Pt presents to be fitted and instructed on the air fracture walker wear.  Pt is fitted and instructed on the application of the AFW and told to be in the AFW as much as possible without sleeping or showering in.  Pt states understanding.

## 2015-01-23 DIAGNOSIS — I82890 Acute embolism and thrombosis of other specified veins: Secondary | ICD-10-CM

## 2015-01-23 HISTORY — DX: Acute embolism and thrombosis of other specified veins: I82.890

## 2015-01-31 ENCOUNTER — Encounter: Payer: Self-pay | Admitting: Family Medicine

## 2015-01-31 ENCOUNTER — Encounter: Payer: BLUE CROSS/BLUE SHIELD | Admitting: Family Medicine

## 2015-02-16 ENCOUNTER — Emergency Department
Admission: EM | Admit: 2015-02-16 | Discharge: 2015-02-16 | Disposition: A | Discharge status: Home / Self Care | Payer: BLUE CROSS/BLUE SHIELD | Attending: Student | Admitting: Student

## 2015-02-16 ENCOUNTER — Encounter: Payer: Self-pay | Admitting: *Deleted

## 2015-02-16 DIAGNOSIS — Z79899 Other long term (current) drug therapy: Secondary | ICD-10-CM | POA: Insufficient documentation

## 2015-02-16 DIAGNOSIS — I82812 Embolism and thrombosis of superficial veins of left lower extremities: Secondary | ICD-10-CM | POA: Diagnosis not present

## 2015-02-16 DIAGNOSIS — M79605 Pain in left leg: Secondary | ICD-10-CM | POA: Diagnosis present

## 2015-02-16 DIAGNOSIS — I82890 Acute embolism and thrombosis of other specified veins: Secondary | ICD-10-CM

## 2015-02-16 HISTORY — DX: Acute embolism and thrombosis of other specified veins: I82.890

## 2015-02-16 MED ORDER — MELOXICAM 15 MG PO TABS: 15.0000 mg | ORAL_TABLET | Freq: Every day | ORAL | Status: DC

## 2015-02-16 MED ORDER — OMEPRAZOLE 10 MG PO CPDR: 10.0000 mg | DELAYED_RELEASE_CAPSULE | Freq: Every day | ORAL | Status: DC

## 2015-02-16 NOTE — ED Notes (Signed)
Pt has a blood clot in left lower leg.  Pt had surgery on left lower leg on December 27.  Pt is wearing a boot on left lower leg.  Pt sent to er for an eval of dvt.

## 2015-02-16 NOTE — ED Provider Notes (Signed)
[facility] Emergency Department Provider Note  ____________________________________________  Time seen: Approximately 9:28 PM  I have reviewed the triage vital signs and the nursing notes.   HISTORY  Chief Complaint DVT    HPI Adrian Moreno is a 45 y.o. male who presents emergency department complaining of left leg pain. Patient had recent surgery for stretching of the Achilles tendon. Patient has been in a walking boot and has had decreased mobility. Patient returned to his surgeon today with a complaint of left lower extremity swelling and pain to the left lateral calf. Patient was sent for an ultrasound which revealed no evidence of deep vein thrombosis but did show thrombus in the posterior branch of the posterior tibial vein. Patient was sent from ultrasound to the emergency department by his provider for further evaluation and treatment. Patient denies any systemic complaints to include chest pain, shortness of breath, headache, visual acuity changes.   Past Medical History  Diagnosis Date  . BPH (benign prostatic hypertrophy)     [place]  . OSA (obstructive sleep apnea) 2014    did not tolerate CPAP  . Hypogonadism in male 04/22/2014    Patient Active Problem List   Diagnosis Date Noted  . Health maintenance examination 07/08/2014  . Hamstring strain 07/08/2014  . Low vitamin B12 level 04/25/2014  . Chest pressure 04/22/2014  . OSA (obstructive sleep apnea) 04/22/2014  . 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 Hypogonadism in male 04/22/2014  . Overweight 04/22/2014  . BPH (benign prostatic hypertrophy) 04/22/2014  . Memory loss 04/22/2014    Past Surgical History  Procedure Laterality Date  . Nasal septum surgery  2014    s/p surgery x2 on same day with complications  . Hip surgery Left 2014    torn labrum  . Colonoscopy  05/2014    int hem, rpt 10 yrs Madilyn Fireman)    Current Outpatient Rx  Name  Route  Sig  Dispense  Refill  . meloxicam  (MOBIC) 15 MG tablet   Oral   Take 1 tablet (15 mg total) by mouth daily.   30 tablet   0   . meloxicam (MOBIC) 7.5 MG tablet   Oral   Take 1 tablet (7.5 mg total) by mouth daily.   30 tablet   2   . methylPREDNISolone (MEDROL DOSEPAK) 4 MG TBPK tablet      Take as directed   21 tablet   0   . omeprazole (PRILOSEC) 10 MG capsule   Oral   Take 1 capsule (10 mg total) by mouth daily.   30 capsule   0   . testosterone cypionate (DEPOTESTOSTERONE CYPIONATE) 200 MG/ML injection      INJECT 0.5ML INTRAMUSCULARLY WEEKLY      3     Allergies Review of patient's allergies indicates no known allergies.  Family History  Problem Relation Age of Onset  . Cancer Father 83    prostate (normal PSAs) deceased at 35  . Hypertension Father   . Diabetes Father   . CAD Neg Hx   . Stroke Neg Hx   . Heart failure Father     Social History Social History  Substance Use Topics  . Smoking status: Never Smoker   . Smokeless tobacco: Never Used  . Alcohol Use: 0.0 oz/week    0 Standard drinks or equivalent per week     Comment: occasional     Review of Systems  Constitutional: No fever/chills Eyes: No visual changes. No discharge  ENT: No sore throat. Cardiovascular: no chest pain. Respiratory: no cough. No SOB. Gastrointestinal: No abdominal pain.  No nausea, no vomiting.  No diarrhea.  No constipation. Genitourinary: Negative for dysuria. No hematuria Musculoskeletal: Negative for back pain. Left leg pain and swelling. Skin: Negative for rash. Neurological: Negative for headaches, focal weakness or numbness. 10-point ROS otherwise negative.  ____________________________________________   PHYSICAL EXAM:  VITAL SIGNS: ED Triage Vitals  Enc Vitals Group     BP 02/16/15 1938 144/91 mmHg     Pulse Rate 02/16/15 1938 88     Resp 02/16/15 1938 20     Temp 02/16/15 1938 97.9 F (36.6 C)     Temp Source 02/16/15 1938 Oral     SpO2 02/16/15 1938 99 %     Weight  02/16/15 1938 200 lb (90.719 kg)     Height 02/16/15 1938  (1.778 m)     Head Cir --      Peak Flow --      Pain Score 02/16/15 1938 4     Pain Loc --      Pain Edu? --      Excl. in GC? --      Constitutional: Alert and oriented. Well appearing and in no acute distress. Eyes: Conjunctivae are normal. PERRL. EOMI. Head: Atraumatic. ENT:      Ears:       Nose: No congestion/rhinnorhea.      Mouth/Throat: Mucous membranes are moist.  Neck: No stridor.   Hematological/Lymphatic/Immunilogical: No cervical lymphadenopathy. Cardiovascular: Normal rate, regular rhythm. Normal S1 and S2.  Good peripheral circulation. Dorsalis pedis pulses appreciated bilaterally. Popliteal pulses appreciated bilaterally. Left lower extremity edema when compared with the right extremity. No pitting edema. The edema is one plus. Surgical incision is healing appropriately with no surrounding erythema or edema. No drainage noted there. There is pain to palpation over the left lateral leg. No palpable abnormality. No palpable thrombosis or cordlike abnormality. Respiratory: Normal respiratory effort without tachypnea or retractions. Lungs CTAB. Gastrointestinal: Soft and nontender. No distention. No CVA tenderness. Musculoskeletal: No lower extremity tenderness nor edema.  No joint effusions. Neurologic:  Normal speech and language. No gross focal neurologic deficits are appreciated.  Skin:  Skin is warm, dry and intact. No rash noted. Psychiatric: Mood and affect are normal. Speech and behavior are normal. Patient exhibits appropriate insight and judgement.   ____________________________________________   LABS (all labs ordered are listed, but only abnormal results are displayed)  Labs Reviewed - No data to display ____________________________________________  EKG   ____________________________________________  RADIOLOGY Ultrasound report from 02/16/2015 from external source is reviewed.  Ultrasound reports no evidence of deep vein thrombosis. Superficial thrombosis of the calf vein. Posterior branch of the posterior tibial vein demonstrates thrombus.  No results found.  ____________________________________________    PROCEDURES  Procedure(s) performed:       Medications - No data to display   ____________________________________________   INITIAL IMPRESSION / ASSESSMENT AND PLAN / ED COURSE  Pertinent labs & imaging results that were available during my care of the patient were reviewed by me and considered in my medical decision making (see chart for details).  Patient's diagnosis is consistent with superficial vein thrombosis. This is an uncomplicated superficial vein thrombosis. No palpable cord. Good peripheral circulation.. Patient will be discharged home with prescriptions for anti-inflammatories and Prilosec to prevent GI upset on anti-inflammatories. Patient is instructed to use a compression stocking to left lower extremity. He is advised to remain  as ambulatory as possible.. Patient is to follow up with his orthopedic surgeon in 7-10 days for repeat ultrasound to visualize this area.Patient is given ED precautions to return to the ED for any worsening or new symptoms.     ____________________________________________  FINAL CLINICAL IMPRESSION(S) / ED DIAGNOSES  Final diagnoses:  Superficial vein thrombosis      NEW MEDICATIONS STARTED DURING THIS VISIT:  New Prescriptions   MELOXICAM (MOBIC) 15 MG TABLET    Take 1 tablet (15 mg total) by mouth daily.   OMEPRAZOLE (PRILOSEC) 10 MG CAPSULE    Take 1 capsule (10 mg total) by mouth daily.        Delorise Royals Ruberta Holck, PA-C 02/16/15 2150  Gayla Doss, MD 02/17/15 [id number]

## 2015-02-16 NOTE — ED Notes (Addendum)
Pt was able to have diagnosis report of venous u/s sent to his home; reviewed by Dr Shaune Pollack and report indicates no evidence of DVT, only superficial thrombus; MD reports no need for further orders or treatment and pt is able to be eval thru [facility]

## 2015-02-16 NOTE — Discharge Instructions (Signed)
Venous Thromboembolism °Venous thromboembolism (VTE) is a condition in which a blood clot (thrombus) develops in the body. A thrombus usually occurs in a deep vein in the leg or the pelvis, but it can also occur in the arm. Sometimes, pieces of a thrombus can break off from its original place of development and travel through the bloodstream to other parts of the body. When that happens, the thrombus is called an embolism. An embolism can block the blood flow in the blood vessels of other organs. °There are two serious types of VTE: °· Deep vein thrombosis (DVT). A DVT is a thrombus that usually occurs in a deep, larger vein of the lower leg or the pelvis, or in an upper extremity such as the arm. °· Pulmonary embolism (PE). A PE occurs when an embolism has formed and traveled to the lungs. A PE can block or decrease the blood flow in one lung or both lungs. °VTE is a serious health condition that can cause disability or death. It is very important to get help right away and to not ignore symptoms. °CAUSES °VTE is caused by the formation of a blood clot in your leg, pelvis, or arm. Usually, several things contribute to the formation of blood clots. A clot may develop when: °· Your blood flow slows down. °· Your vein becomes damaged in some way. °· You have a condition that makes your blood clot more easily. °RISK FACTORS °A VTE is more likely to develop in: °· People who are older, especially over 60 years of age. °· People who are overweight (obese). °· People who sit or lie still for a long time, such as during long-distance travel (over 4 hours), bed rest, hospitalization, or during recovery from certain medical conditions like a stroke. °· People who do not engage in much physical activity (sedentary lifestyle). °· People who have chronic breathing disorders. °· People who have a personal or family history of blood clots or blood clotting disease. °· People who have peripheral vascular disease (PVD), diabetes,  or some types of cancer. °· People who have heart disease, especially if the person had a recent heart attack or has congestive heart failure. °· People who have neurological diseases that affect the legs (leg paresis). °· People who have had a traumatic injury, such as breaking a hip or leg. °· People who have recently had major or lengthy surgery, especially on the hip, knee, or abdomen. °· People who have had a central line placed inside a large vein. °· People who take medicines that contain the hormone estrogen. These include birth control pills and hormone replacement therapy. °· Pregnancy or during childbirth or the postpartum period. °· Long plane flights (over 8 hours). °SIGNS AND SYMPTOMS  °Symptoms of VTE can depend on where the clot is located and whether the clot breaks off and travels to another organ. Sometimes, there may be no symptoms. °Symptoms of a DVT can include: °· Swelling of your leg or arm, especially if one side is much worse. °· Warmth and redness of your leg or arm, especially if one side is much worse. °· Pain in your arm or leg. If the clot is in your leg, symptoms may be more noticeable or worse when you stand or walk. °· A feeling of pins and needles if the clot is in the arm. °The symptoms of a PE usually start suddenly and include: °· Shortness of breath while active or at rest. °· Coughing or coughing up blood or   blood-tinged mucus. °· Chest pain that is often worse with deep breaths. °· Rapid or irregular heartbeat. °· Feeling light-headed or dizzy. °· Fainting. °· Feeling anxious. °· Sweating. °There may also be pain and swelling in a leg if that is where the blood clot started. °These symptoms may represent a serious problem that is an emergency. Do not wait to see if the symptoms will go away. Get medical help right away. Call your local emergency services (911 in the [place] Do not drive yourself to the hospital. °DIAGNOSIS °Your health care provider will take a medical history  and perform a physical exam. You may also have other tests, including: °· Blood tests to assess the clotting properties of your blood. °· Imaging tests, such as CT, ultrasound, MRI, X-ray, and other tests to see if you have clots anywhere in your body. °· An electrocardiogram (ECG) to look for heart strain from blood clots in the lungs. °· An echocardiogram. °TREATMENT °After a VTE is identified, it can be treated. The main goals of treatment are: °· To stop a blood clot from growing larger. °· To stop new blood clots from forming. °· To stop a blood clot from traveling to the lungs (pulmonary embolism). °The type of treatment that you receive depends on many factors, such as the cause of your VTE, your risk for bleeding or developing more clots, and other medical conditions that you have. Sometimes, a combination of treatments is necessary. Treatment options may be combined and include: °· Monitoring the blood clot with ultrasound. °· Taking medicines by mouth, such as newer blood thinners (anticoagulants), thrombolytics, or warfarin. °· Taking anticoagulant medicine by injection or through an IV tube. °· Wearing compression stockings or using different types of devices. °· Surgery (rare) to remove the blood clot or to place a filter in your abdomen to stop the blood clot from traveling to your lungs. °Treatments for VTE are often divided into immediate treatment and long-term treatment (up to 3 months after VTE). You can work with your health care provider to choose the treatment program that is best for you. °HOME CARE INSTRUCTIONS °If you are taking a newer oral anticoagulant:  °· Take the medicine every single day at the same time each day. °· Understand what foods and drugs interact with this medicine. °· Understand that there are no regular blood tests required when using this medicine. °· Understand the side effects of this medicine, including excessive bruising or bleeding. Ask your health care provider or  pharmacist about other possible side effects. °If you are taking warfarin: °· Understand how to take warfarin and know which foods can affect how warfarin works in your body. °· Understand that it is dangerous to take too much or too little warfarin. Too much warfarin increases the risk of bleeding. Too little warfarin continues to allow the risk for blood clots. °· Follow your PT and INR blood testing schedule. The PT and INR results allow your health care provider to adjust your dose of warfarin. It is very important that you have your PT and INR tested as often as told by your health care provider. °· Avoid major changes in your diet, or tell your health care provider before you change your diet. Arrange a visit with a registered dietitian to answer your questions. Many foods, especially foods that are high in vitamin K, can interfere with warfarin and affect the PT and INR results. Eat a consistent amount of foods that are high in vitamin K, such as: °¨   Spinach, kale, broccoli, cabbage, collard greens, turnip greens, Brussels sprouts, peas, cauliflower, seaweed, and parsley. °¨ Beef liver and pork liver. °¨ Green tea. °¨ Soybean oil. °· Tell your health care provider about any and all medicines, vitamins, and supplements that you take, including aspirin and other over-the-counter anti-inflammatory medicines. Be especially cautious with aspirin and anti-inflammatory medicines. Do not take those before you ask your health care provider if it is safe to do so. This is important because many medicines can interfere with warfarin and affect the PT and INR results. °· Do not start or stop taking any over-the-counter or prescription medicine unless your health care provider or pharmacist tells you to do so. °If you take warfarin, you will also need to do these things: °· Hold pressure over cuts for longer than usual. °· Tell your dentist and other health care providers that you are taking warfarin before you have any  procedures in which bleeding may occur. °· Avoid alcohol or drink very small amounts. Tell your health care provider if you change your alcohol intake. °· Do not use tobacco products, including cigarettes, chewing tobacco, and e-cigarettes. If you need help quitting, ask your health care provider. °· Avoid contact sports. °General Instructions °· Take over-the-counter and prescription medicines only as told by your health care provider. Anticoagulant medicines can have side effects, including easy bruising and difficulty stopping bleeding. If you are prescribed an anticoagulant, you will also need to do these things: °¨ Hold pressure over cuts for longer than usual. °¨ Tell your dentist and other health care providers that you are taking anticoagulants before you have any procedures in which bleeding may occur. °¨ Avoid contact sports. °· Wear a medical alert bracelet or carry a medical alert card that says you have had a PE. °· Ask your health care provider how soon you can go back to your normal activities. Stay active to prevent new blood clots from forming. °· Make sure to exercise while traveling or when you have been sitting or standing for a long period of time. It is very important to exercise. Exercise your legs by walking or by tightening and relaxing your leg muscles often. Take frequent walks. °· Wear compression stockings as told by your health care provider to help prevent more blood clots from forming. °· Do not use tobacco products, including cigarettes, chewing tobacco, and e-cigarettes. If you need help quitting, ask your health care provider. °· Keep all follow-up appointments with your health care provider. This is important. °PREVENTION °Take these actions to decrease your risk of developing another VTE: °· Exercise regularly. For at least 30 minutes every day, engage in: °¨ Activity that involves moving your arms and legs. °¨ Activity that encourages good blood flow through your body by  increasing your heart rate. °· Exercise your arms and legs every hour during long-distance travel (over 4 hours). Drink plenty of water and avoid drinking alcohol while traveling. °· Avoid sitting or lying in bed for long periods of time without moving your legs. °· Maintain a weight that is appropriate for your height. Ask your health care provider what weight is healthy for you. °· If you are a woman who is over 35 years of age, avoid unnecessary use of medicines that contain estrogen. These include birth control pills. °· Do not smoke, especially if you take estrogen medicines. If you need help quitting, ask your health care provider. °If you are hospitalized, prevention measures may include: °· Early walking after surgery,   as soon as your health care provider says that it is safe. °· Receiving anticoagulants to prevent blood clots. If you cannot take anticoagulants, other options may be available, such as wearing compression stockings or using different types of devices. °SEEK IMMEDIATE MEDICAL CARE IF: °· You have new or increased pain, swelling, or redness in an arm or leg. °· You have numbness or tingling in an arm or leg. °· You have shortness of breath while active or at rest. °· You have chest pain. °· You have a rapid or irregular heartbeat. °· You feel light-headed or dizzy. °· You cough up blood. °· You notice blood in your vomit, bowel movement, or urine. °These symptoms may represent a serious problem that is an emergency. Do not wait to see if the symptoms will go away. Get medical help right away. Call your local emergency services (911 in the [place] Do not drive yourself to the hospital. °  °This information is not intended to replace advice given to you by your health care provider. Make sure you discuss any questions you have with your health care provider. °  °Document Released: 11/05/2008 Document Revised: 09/29/2014 Document Reviewed: 05/05/2014 °[employer] Interactive Patient Education ©2016  [employer] ° °

## 2015-02-17 ENCOUNTER — Encounter: Payer: Self-pay | Admitting: Family Medicine

## 2015-04-20 ENCOUNTER — Encounter: Payer: BLUE CROSS/BLUE SHIELD | Admitting: Family Medicine

## 2015-06-24 LAB — HEMOGLOBIN: HEMOGLOBIN: 18.6 g/dL

## 2015-06-24 LAB — TESTOSTERONE: Testosterone, total: 650.6

## 2015-06-24 LAB — PSA: PSA: 0.39

## 2015-06-24 LAB — HEMATOCRIT: HEMATOCRIT: 56 %

## 2015-07-06 ENCOUNTER — Other Ambulatory Visit: Payer: Self-pay | Admitting: Family Medicine

## 2015-07-06 DIAGNOSIS — E291 Testicular hypofunction: Secondary | ICD-10-CM

## 2015-07-06 DIAGNOSIS — R7989 Other specified abnormal findings of blood chemistry: Secondary | ICD-10-CM | POA: Insufficient documentation

## 2015-07-06 DIAGNOSIS — E538 Deficiency of other specified B group vitamins: Secondary | ICD-10-CM

## 2015-07-07 ENCOUNTER — Other Ambulatory Visit: Payer: BLUE CROSS/BLUE SHIELD

## 2015-07-14 ENCOUNTER — Encounter: Payer: BLUE CROSS/BLUE SHIELD | Admitting: Family Medicine

## 2015-07-22 ENCOUNTER — Encounter: Payer: Self-pay | Admitting: Family Medicine

## 2015-07-22 ENCOUNTER — Ambulatory Visit (INDEPENDENT_AMBULATORY_CARE_PROVIDER_SITE_OTHER): Payer: BLUE CROSS/BLUE SHIELD | Admitting: Family Medicine

## 2015-07-22 VITALS — BP 108/80 | HR 80 | Temp 97.7°F | Ht 68.5 in | Wt 199.0 lb

## 2015-07-22 DIAGNOSIS — E291 Testicular hypofunction: Secondary | ICD-10-CM | POA: Diagnosis not present

## 2015-07-22 DIAGNOSIS — G4733 Obstructive sleep apnea (adult) (pediatric): Secondary | ICD-10-CM | POA: Diagnosis not present

## 2015-07-22 DIAGNOSIS — F418 Other specified anxiety disorders: Secondary | ICD-10-CM | POA: Diagnosis not present

## 2015-07-22 DIAGNOSIS — E663 Overweight: Secondary | ICD-10-CM | POA: Diagnosis not present

## 2015-07-22 DIAGNOSIS — Z0000 Encounter for general adult medical examination without abnormal findings: Secondary | ICD-10-CM | POA: Diagnosis not present

## 2015-07-22 MED ORDER — HYDROXYZINE HCL 25 MG PO TABS: 12.5000 mg | ORAL_TABLET | Freq: Two times a day (BID) | ORAL | Status: DC | PRN

## 2015-07-22 NOTE — Progress Notes (Signed)
BP 108/80 mmHg  Pulse 80  Temp(Src) 97.7 F (36.5 C) (Oral)  Ht 5' 8.5" (1.74 m)  Wt 199 lb (90.266 kg)  BMI 29.81 kg/m2  SpO2 96%   CC: CPE  Subjective:    Patient ID: Adrian Moreno, male    DOB: 01/15/1971, 45 y.o.   MRN: [id number]  HPI: Adrian Moreno is a 45 y.o. male presenting on 07/22/2015 for Annual Exam   Hypogonadism - followed by Dr Annabell HowellsWrenn.  More trouble with sleep over the past year. Easily falls asleep. Has cut down on alcohol or caffeine.  OSA - positive sleep study but not on treatment. Was unable to tolerate CPAP machine.   Increasing situational anxiety around presentations to large groups - some need for this at work. Does not want habit forming medication.   Preventative: Colonoscopy 05/2014 - int hemorrhoids otherwise normal rpt 10 yrs (Dr Madilyn FiremanHayes) Prostate cancer - father with history age 46, deceased 3665.  Flu - declines Tetanus ~2012 Seat belt use discussed  Sunscreen use discussed. No changing moles on skin  Lives with wife, 1 dog Edu: some college Occupation: Retail bankeraircraft mechanic at [employer]  Activity: no regular exercise currently, active at work  Diet: good water, fruits/vegetables daily, avoiding fast foods and pop   Relevant past medical, surgical, family and social history reviewed and updated as indicated. Interim medical history since our last visit reviewed. Allergies and medications reviewed and updated. Current Outpatient Prescriptions on File Prior to Visit  Medication Sig  . testosterone cypionate (DEPOTESTOSTERONE CYPIONATE) 200 MG/ML injection INJECT 0.5ML INTRAMUSCULARLY WEEKLY   No current facility-administered medications on file prior to visit.    Review of Systems  Constitutional: Negative for fever, chills, activity change, appetite change, fatigue and unexpected weight change.  HENT: Negative for hearing loss.   Eyes: Negative for visual disturbance.  Respiratory: Negative for cough, chest tightness, shortness of breath and  wheezing.   Cardiovascular: Negative for chest pain, palpitations and leg swelling.       L foot swelling after surgery  Gastrointestinal: Negative for nausea, vomiting, abdominal pain, diarrhea, constipation, blood in stool and abdominal distention.  Genitourinary: Negative for hematuria and difficulty urinating.  Musculoskeletal: Negative for myalgias, arthralgias and neck pain.  Skin: Negative for rash.  Neurological: Positive for headaches (increasing over last 6 months). Negative for dizziness, seizures and syncope.  Hematological: Negative for adenopathy. Does not bruise/bleed easily.  Psychiatric/Behavioral: Negative for dysphoric mood. The patient is not nervous/anxious.    Per HPI unless specifically indicated in ROS section     Objective:    BP 108/80 mmHg  Pulse 80  Temp(Src) 97.7 F (36.5 C) (Oral)  Ht 5' 8.5" (1.74 m)  Wt 199 lb (90.266 kg)  BMI 29.81 kg/m2  SpO2 96%  Wt Readings from Last 3 Encounters:  07/22/15 199 lb (90.266 kg)  02/16/15 200 lb (90.719 kg)  11/30/14 200 lb (90.719 kg)    Physical Exam  Constitutional: He is oriented to person, place, and time. He appears well-developed and well-nourished. No distress.  HENT:  Head: Normocephalic and atraumatic.  Right Ear: Hearing, tympanic membrane, external ear and ear canal normal.  Left Ear: Hearing, tympanic membrane, external ear and ear canal normal.  Nose: Nose normal.  Mouth/Throat: Uvula is midline, oropharynx is clear and moist and mucous membranes are normal. No oropharyngeal exudate, posterior oropharyngeal edema or posterior oropharyngeal erythema.  Eyes: Conjunctivae and EOM are normal. Pupils are equal, round, and reactive to light. No scleral icterus.  Neck: Normal range of motion. Neck supple. No thyromegaly present.  Cardiovascular: Normal rate, regular rhythm, normal heart sounds and intact distal pulses.   No murmur heard. Pulses:      Radial pulses are 2+ on the right side, and 2+ on  the left side.  Pulmonary/Chest: Effort normal and breath sounds normal. No respiratory distress. He has no wheezes. He has no rales.  Abdominal: Soft. Bowel sounds are normal. He exhibits no distension and no mass. There is no tenderness. There is no rebound and no guarding.  Musculoskeletal: Normal range of motion. He exhibits no edema.  Lymphadenopathy:    He has no cervical adenopathy.  Neurological: He is alert and oriented to person, place, and time.  CN grossly intact, station and gait intact  Skin: Skin is warm and dry. No rash noted.  Psychiatric: He has a normal mood and affect. His behavior is normal. Judgment and thought content normal.  Nursing note and vitals reviewed.     Assessment & Plan:   Problem List Items Addressed This Visit    OSA (obstructive sleep apnea)    If ongoing sleep maintenance insomnia, suggested return to pulm to re evaluate OSA.      Hypogonadism in male    Followed by urology. Will request latest labs from Dr Belva CromeWrenn's office.      Overweight    Discussed healthy diet and lifestyle changes to affect sustainable weight loss.      Health maintenance examination - Primary    Preventative protocols reviewed and updated unless pt declined. Discussed healthy diet and lifestyle.       Situational anxiety    Discussed anxiety. Pt declines psychological evaluation.  Pt desires to avoid habit forming medication. Trial hydroxyzine prn high anxiety situations      Relevant Medications   hydrOXYzine (ATARAX/VISTARIL) 25 MG tablet       Follow up plan: Return in about 1 year (around 07/21/2016), or as needed, for annual exam, prior fasting for blood work.  Eustaquio BoydenJavier Hellen Shanley, MD

## 2015-07-22 NOTE — Assessment & Plan Note (Signed)
Discussed healthy diet and lifestyle changes to affect sustainable weight loss  

## 2015-07-22 NOTE — Assessment & Plan Note (Signed)
Followed by urology. Will request latest labs from Dr Belva CromeWrenn's office.

## 2015-07-22 NOTE — Assessment & Plan Note (Signed)
If ongoing sleep maintenance insomnia, suggested return to pulm to re evaluate OSA.

## 2015-07-22 NOTE — Progress Notes (Signed)
Pre visit review using our clinic review tool, if applicable. No additional management support is needed unless otherwise documented below in the visit note. 

## 2015-07-22 NOTE — Patient Instructions (Addendum)
Sign release for records from Dr Annabell HowellsWrenn I will review latest labs from urology.  Try hydroxyzine as needed for situational anxiety (1/2 tablet).  You are doing well today. Return as needed or in for next physical  Health Maintenance, Male A healthy lifestyle and preventative care can promote health and wellness.  Maintain regular health, dental, and eye exams.  Eat a healthy diet. Foods like vegetables, fruits, whole grains, low-fat dairy products, and lean protein foods contain the nutrients you need and are low in calories. Decrease your intake of foods high in solid fats, added sugars, and salt. Get information about a proper diet from your health care provider, if necessary.  Regular physical exercise is one of the most important things you can do for your health. Most adults should get at least 150 minutes of moderate-intensity exercise (any activity that increases your heart rate and causes you to sweat) each week. In addition, most adults need muscle-strengthening exercises on 2 or more days a week.   Maintain a healthy weight. The body mass index (BMI) is a screening tool to identify possible weight problems. It provides an estimate of body fat based on height and weight. Your health care provider can find your BMI and can help you achieve or maintain a healthy weight. For males 20 years and older:  A BMI below 18.5 is considered underweight.  A BMI of 18.5 to 24.9 is normal.  A BMI of 25 to 29.9 is considered overweight.  A BMI of 30 and above is considered obese.  Maintain normal blood lipids and cholesterol by exercising and minimizing your intake of saturated fat. Eat a balanced diet with plenty of fruits and vegetables. Blood tests for lipids and cholesterol should begin at age 46 and be repeated every 5 years. If your lipid or cholesterol levels are high, you are over age 46, or you are at high risk for heart disease, you may need your cholesterol levels checked more  frequently.Ongoing high lipid and cholesterol levels should be treated with medicines if diet and exercise are not working.  If you smoke, find out from your health care provider how to quit. If you do not use tobacco, do not start.  Lung cancer screening is recommended for adults aged 55-80 years who are at high risk for developing lung cancer because of a history of smoking. A yearly low-dose CT scan of the lungs is recommended for people who have at least a 30-pack-year history of smoking and are current smokers or have quit within the past 15 years. A pack year of smoking is smoking an average of 1 pack of cigarettes a day for 1 year (for example, a 30-pack-year history of smoking could mean smoking 1 pack a day for 30 years or 2 packs a day for 15 years). Yearly screening should continue until the smoker has stopped smoking for at least 15 years. Yearly screening should be stopped for people who develop a health problem that would prevent them from having lung cancer treatment.  If you choose to drink alcohol, do not have more than 2 drinks per day. One drink is considered to be 12 oz (360 mL) of beer, 5 oz (150 mL) of wine, or 1.5 oz (45 mL) of liquor.  Avoid the use of street drugs. Do not share needles with anyone. Ask for help if you need support or instructions about stopping the use of drugs.  High blood pressure causes heart disease and increases the risk of stroke.  High blood pressure is more likely to develop in:  People who have blood pressure in the end of the normal range (100-139/85-89 mm Hg).  People who are overweight or obese.  People who are African American.  If you are 70-35 years of age, have your blood pressure checked every 3-5 years. If you are 33 years of age or older, have your blood pressure checked every year. You should have your blood pressure measured twice--once when you are at a hospital or clinic, and once when you are not at a hospital or clinic. Record the  average of the two measurements. To check your blood pressure when you are not at a hospital or clinic, you can use:  An automated blood pressure machine at a pharmacy.  A home blood pressure monitor.  If you are 41-10 years old, ask your health care provider if you should take aspirin to prevent heart disease.  Diabetes screening involves taking a blood sample to check your fasting blood sugar level. This should be done once every 3 years after age 46 if you are at a normal weight and without risk factors for diabetes. Testing should be considered at a younger age or be carried out more frequently if you are overweight and have at least 1 risk factor for diabetes.  Colorectal cancer can be detected and often prevented. Most routine colorectal cancer screening begins at the age of 10 and continues through age 70. However, your health care provider may recommend screening at an earlier age if you have risk factors for colon cancer. On a yearly basis, your health care provider may provide home test kits to check for hidden blood in the stool. A small camera at the end of a tube may be used to directly examine the colon (sigmoidoscopy or colonoscopy) to detect the earliest forms of colorectal cancer. Talk to your health care provider about this at age 59 when routine screening begins. A direct exam of the colon should be repeated every 5-10 years through age 40, unless early forms of precancerous polyps or small growths are found.  People who are at an increased risk for hepatitis B should be screened for this virus. You are considered at high risk for hepatitis B if:  You were born in a country where hepatitis B occurs often. Talk with your health care provider about which countries are considered high risk.  Your parents were born in a high-risk country and you have not received a shot to protect against hepatitis B (hepatitis B vaccine).  You have HIV or AIDS.  You use needles to inject street  drugs.  You live with, or have sex with, someone who has hepatitis B.  You are a man who has sex with other men (MSM).  You get hemodialysis treatment.  You take certain medicines for conditions like cancer, organ transplantation, and autoimmune conditions.  Hepatitis C blood testing is recommended for all people born from [birth year] through 1966 and any individual with known risk factors for hepatitis C.  Healthy men should no longer receive prostate-specific antigen (PSA) blood tests as part of routine cancer screening. Talk to your health care provider about prostate cancer screening.  Testicular cancer screening is not recommended for adolescents or adult males who have no symptoms. Screening includes self-exam, a health care provider exam, and other screening tests. Consult with your health care provider about any symptoms you have or any concerns you have about testicular cancer.  Practice safe sex. Use condoms  and avoid high-risk sexual practices to reduce the spread of sexually transmitted infections (STIs).  You should be screened for STIs, including gonorrhea and chlamydia if:  You are sexually active and are younger than 24 years.  You are older than 24 years, and your health care provider tells you that you are at risk for this type of infection.  Your sexual activity has changed since you were last screened, and you are at an increased risk for chlamydia or gonorrhea. Ask your health care provider if you are at risk.  If you are at risk of being infected with HIV, it is recommended that you take a prescription medicine daily to prevent HIV infection. This is called pre-exposure prophylaxis (PrEP). You are considered at risk if:  You are a man who has sex with other men (MSM).  You are a heterosexual man who is sexually active with multiple partners.  You take drugs by injection.  You are sexually active with a partner who has HIV.  Talk with your health care provider about  whether you are at high risk of being infected with HIV. If you choose to begin PrEP, you should first be tested for HIV. You should then be tested every 3 months for as long as you are taking PrEP.  Use sunscreen. Apply sunscreen liberally and repeatedly throughout the day. You should seek shade when your shadow is shorter than you. Protect yourself by wearing long sleeves, pants, a wide-brimmed hat, and sunglasses year round whenever you are outdoors.  Tell your health care provider of new moles or changes in moles, especially if there is a change in shape or color. Also, tell your health care provider if a mole is larger than the size of a pencil eraser.  A one-time screening for abdominal aortic aneurysm (AAA) and surgical repair of large AAAs by ultrasound is recommended for men aged 11-75 years who are current or former smokers.  Stay current with your vaccines (immunizations).   This information is not intended to replace advice given to you by your health care provider. Make sure you discuss any questions you have with your health care provider.   Document Released: 07/07/2007 Document Revised: 01/29/2014 Document Reviewed: 06/05/2010 Elsevier Interactive Patient Education [employer].

## 2015-07-22 NOTE — Assessment & Plan Note (Signed)
Preventative protocols reviewed and updated unless pt declined. Discussed healthy diet and lifestyle.  

## 2015-07-22 NOTE — Assessment & Plan Note (Signed)
Discussed anxiety. Pt declines psychological evaluation.  Pt desires to avoid habit forming medication. Trial hydroxyzine prn high anxiety situations

## 2015-08-07 ENCOUNTER — Encounter: Payer: Self-pay | Admitting: Family Medicine

## 2015-08-07 DIAGNOSIS — D751 Secondary polycythemia: Secondary | ICD-10-CM | POA: Insufficient documentation

## 2015-08-10 ENCOUNTER — Encounter: Payer: Self-pay | Admitting: *Deleted

## 2018-06-23 DIAGNOSIS — M7661 Achilles tendinitis, right leg: Secondary | ICD-10-CM | POA: Insufficient documentation

## 2020-10-07 ENCOUNTER — Other Ambulatory Visit: Payer: Self-pay

## 2020-10-07 ENCOUNTER — Ambulatory Visit (INDEPENDENT_AMBULATORY_CARE_PROVIDER_SITE_OTHER): Payer: BC Managed Care – PPO

## 2020-10-07 ENCOUNTER — Ambulatory Visit
Admission: RE | Admit: 2020-10-07 | Discharge: 2020-10-07 | Disposition: A | Discharge status: Home / Self Care | Payer: BC Managed Care – PPO | Source: Ambulatory Visit | Attending: Emergency Medicine | Admitting: Emergency Medicine

## 2020-10-07 VITALS — BP 138/95 | HR 85 | Temp 98.9°F | Resp 20

## 2020-10-07 DIAGNOSIS — J209 Acute bronchitis, unspecified: Secondary | ICD-10-CM

## 2020-10-07 DIAGNOSIS — R0602 Shortness of breath: Secondary | ICD-10-CM

## 2020-10-07 DIAGNOSIS — R059 Cough, unspecified: Secondary | ICD-10-CM | POA: Diagnosis not present

## 2020-10-07 MED ORDER — DOXYCYCLINE HYCLATE 100 MG PO CAPS: 100.0000 mg | ORAL_CAPSULE | Freq: Two times a day (BID) | ORAL | 0 refills | Status: AC

## 2020-10-07 MED ORDER — BENZONATATE 100 MG PO CAPS: 100.0000 mg | ORAL_CAPSULE | Freq: Three times a day (TID) | ORAL | 0 refills | Status: DC

## 2020-10-07 MED ORDER — ALBUTEROL SULFATE HFA 108 (90 BASE) MCG/ACT IN AERS: 1.0000 | INHALATION_SPRAY | Freq: Four times a day (QID) | RESPIRATORY_TRACT | 0 refills | Status: DC | PRN

## 2020-10-07 MED ORDER — PREDNISONE 10 MG PO TABS: ORAL_TABLET | ORAL | 0 refills | Status: AC

## 2020-10-07 NOTE — ED Triage Notes (Signed)
Pt here with cough that is mildly productive x 10 days. Reports 25 pounds of weight loss in 4 months without trying.

## 2020-10-07 NOTE — Discharge Instructions (Addendum)
DO NOT fill your Doxycycline prescription unless symptoms do not improve over the next 6 days with use of prednisone, tessalon perles, and proair.  Rest, push lots of fluids (especially water), and utilize supportive care for symptoms. You may take take acetaminophen (Tylenol) every 4-6 hours or ibuprofen every 6-8 hours for muscle pain, joint pain, headaches. Mucinex (guaifenesin) may be taken over the counter for cough as needed and can loosen phlegm. Please read the instructions and take as directed. Saline nasal sprays to rinse congestion can help as well. Warm tea with lemon and honey can sooth sore throat and cough, as can cough drops.  Take Tessalon Perles with Mucinex for cough.   The normal course of bronchitis is 1-3 weeks. Return to clinic for new-onset fever, difficulty breathing, chest pain, symptoms lasting >3 to 4 weeks, or bloody sputum.

## 2020-10-07 NOTE — ED Provider Notes (Addendum)
CHIEF COMPLAINT:   Chief Complaint  Patient presents with   Cough   Weight Loss     SUBJECTIVE/HPI:   Cough A very pleasant 50 y.o.Male presents today with mildly productive cough for the last 10 days.  Patient also reports about 25 pounds of weight loss in the last 4 months without trying.  Patient does state that he had COVID-19 about 4 months ago. Patient does not report any shortness of breath, chest pain, palpitations, visual changes, weakness, tingling, headache, nausea, vomiting, diarrhea, fever, chills.   has a past medical history of BPH (benign prostatic hypertrophy), Hypogonadism in male (04/22/2014), OSA (obstructive sleep apnea) (2014), and Superficial vein thrombosis (01/2015).  ROS:  Review of Systems  Respiratory:  Positive for cough.   See Subjective/HPI Medications, Allergies and Problem List personally reviewed in Epic today OBJECTIVE:   Vitals:   10/07/20 1334  BP: (!) 138/95  Pulse: 85  Resp: 20  Temp: 98.9 F (37.2 C)  SpO2: 96%    Physical Exam   General: Appears well-developed and well-nourished. No acute distress.  HEENT Head: Normocephalic and atraumatic.   Ears: Hearing grossly intact, no drainage or visible deformity.  Nose: No nasal deviation.   Mouth/Throat: No stridor or tracheal deviation.  Non erythematous posterior pharynx noted with clear drainage present.  No white patchy exudate noted. Eyes: Conjunctivae and EOM are normal. No eye drainage or scleral icterus bilaterally.  Neck: Normal range of motion, neck is supple.  Cardiovascular: Normal rate. Regular rhythm; no murmurs, gallops, or rubs.  Pulm/Chest: No respiratory distress. Breath sounds normal bilaterally without wheezes, rhonchi, or rales.  Intermittent forceful cough noted. Neurological: Alert and oriented to person, place, and time.  Skin: Skin is warm and dry.  No rashes, lesions, abrasions or bruising noted to skin.   Psychiatric: Normal mood, affect, behavior, and thought  content.   Vital signs and nursing note reviewed.   Patient stable and cooperative with examination. PROCEDURES:    LABS/X-RAYS/EKG/MEDS:   No results found for any visits on 10/07/20.  MEDICAL DECISION MAKING:   Patient presents with mildly productive cough for the last 10 days.  Patient also reports about 25 pounds of weight loss in the last 4 months without trying.  Patient does state that he had COVID-19 about 4 months ago. Patient does not report any shortness of breath, chest pain, palpitations, visual changes, weakness, tingling, headache, nausea, vomiting, diarrhea, fever, chills. CXR reveals mild diffuse reticular opacities, no pneumonia or pneumothorax.  As read by me, overread pending.  Given symptoms and assessment findings, likely bronchitis.  Rx'd Tessalon Perles, prednisone, albuterol.  Did advise the patient that if he does not improve or worsens over the next 6 days that he will need to start his doxycycline prescription which was given to him today as a fold and hold.  Advised about home treatment and care which includes rest, fluids, ibuprofen, Tylenol and Mucinex.  Advised to return to clinic for new onset fever, difficulty breathing, chest pain, symptoms lasting longer than 3 to 4 weeks or bloody sputum.  Patient verbalized understanding and agreed with treatment plan.  Patient stable upon discharge. ASSESSMENT/PLAN:  1. Acute bronchitis, unspecified organism - benzonatate (TESSALON) 100 MG capsule; Take 1 capsule (100 mg total) by mouth every 8 (eight) hours.  Dispense: 21 capsule; Refill: 0 - predniSONE (DELTASONE) 10 MG tablet; Take 6 tablets (60 mg total) by mouth daily for 1 day, THEN 5 tablets (50 mg total) daily for 1 day,  THEN 4 tablets (40 mg total) daily for 1 day, THEN 3 tablets (30 mg total) daily for 1 day, THEN 2 tablets (20 mg total) daily for 1 day, THEN 1 tablet (10 mg total) daily for 1 day.  Dispense: 21 tablet; Refill: 0 - albuterol (VENTOLIN HFA) 108 (90  Base) MCG/ACT inhaler; Inhale 1-2 puffs into the lungs every 6 (six) hours as needed (cough).  Dispense: 6.7 g; Refill: 0  Meds ordered this encounter  Medications   benzonatate (TESSALON) 100 MG capsule    Sig: Take 1 capsule (100 mg total) by mouth every 8 (eight) hours.    Dispense:  21 capsule    Refill:  0    Order Specific Question:   Supervising Provider    Answer:   Merrilee Jansky [[id number]]   predniSONE (DELTASONE) 10 MG tablet    Sig: Take 6 tablets (60 mg total) by mouth daily for 1 day, THEN 5 tablets (50 mg total) daily for 1 day, THEN 4 tablets (40 mg total) daily for 1 day, THEN 3 tablets (30 mg total) daily for 1 day, THEN 2 tablets (20 mg total) daily for 1 day, THEN 1 tablet (10 mg total) daily for 1 day.    Dispense:  21 tablet    Refill:  0    Order Specific Question:   Supervising Provider    Answer:   Loreli Dollar   albuterol (VENTOLIN HFA) 108 (90 Base) MCG/ACT inhaler    Sig: Inhale 1-2 puffs into the lungs every 6 (six) hours as needed (cough).    Dispense:  6.7 g    Refill:  0    Order Specific Question:   Supervising Provider    Answer:   Merrilee Jansky [id number]   doxycycline (VIBRAMYCIN) 100 MG capsule    Sig: Take 1 capsule (100 mg total) by mouth 2 (two) times daily for 10 days.    Dispense:  20 capsule    Refill:  0    Order Specific Question:   Supervising Provider    Answer:   Merrilee Jansky [id number]    Instructions about new medications and side effects provided.  Plan:   Discharge Instructions      DO NOT fill your Doxycycline prescription unless symptoms do not improve over the next 6 days with use of prednisone, tessalon perles, and proair.  Rest, push lots of fluids (especially water), and utilize supportive care for symptoms. You may take take acetaminophen (Tylenol) every 4-6 hours or ibuprofen every 6-8 hours for muscle pain, joint pain, headaches. Mucinex (guaifenesin) may be taken over the counter for cough as  needed and can loosen phlegm. Please read the instructions and take as directed. Saline nasal sprays to rinse congestion can help as well. Warm tea with lemon and honey can sooth sore throat and cough, as can cough drops.  Take Tessalon Perles with Mucinex for cough.   The normal course of bronchitis is 1-3 weeks. Return to clinic for new-onset fever, difficulty breathing, chest pain, symptoms lasting >3 to 4 weeks, or bloody sputum.          Amalia Greenhouse, FNP 10/07/20 1436    Amalia Greenhouse, FNP 10/07/20 1436

## 2020-11-24 ENCOUNTER — Encounter: Payer: Self-pay | Admitting: Family Medicine

## 2020-11-24 ENCOUNTER — Ambulatory Visit (INDEPENDENT_AMBULATORY_CARE_PROVIDER_SITE_OTHER): Payer: BC Managed Care – PPO | Admitting: Family Medicine

## 2020-11-24 ENCOUNTER — Other Ambulatory Visit: Payer: Self-pay

## 2020-11-24 VITALS — BP 137/90 | HR 69 | Temp 98.0°F | Resp 16 | Ht 69.0 in | Wt 188.8 lb

## 2020-11-24 DIAGNOSIS — Z7689 Persons encountering health services in other specified circumstances: Secondary | ICD-10-CM

## 2020-11-24 DIAGNOSIS — R1013 Epigastric pain: Secondary | ICD-10-CM | POA: Diagnosis not present

## 2020-11-24 DIAGNOSIS — M545 Low back pain, unspecified: Secondary | ICD-10-CM | POA: Diagnosis not present

## 2020-11-24 MED ORDER — TRAMADOL HCL 50 MG PO TABS: 50.0000 mg | ORAL_TABLET | Freq: Three times a day (TID) | ORAL | 0 refills | Status: AC | PRN

## 2020-11-24 NOTE — Progress Notes (Signed)
New Patient Office Visit  Subjective:  Patient ID: Adrian Moreno, male    DOB: [birth year]  Age: 50 y.o. MRN: [id number]  CC:  Chief Complaint  Patient presents with   Establish Care    HPI Adrian Moreno presents for to establish care. Patient complains of lower back pain.  Patient reports that he initially started having symptoms years ago while in the [employer].  He denies known recent trauma or injury.  He does report that symptoms seem to be getting more intense more frequent lately.  He was recently seen in the ED because symptoms had increased to that level.  He was given pain meds and muscle relaxer which he has not taken.  He does utilize Tylenol or ibuprofen and topical preps.  Patient also reports a persistent stomach issues since he had COVID.  He reports a type of burning sensation in his stomach area.  The symptoms are intermittent.  He denies nausea and vomiting.  Past Medical History:  Diagnosis Date   BPH (benign prostatic hypertrophy)    Wrenn   Hypogonadism in male 04/22/2014   OSA (obstructive sleep apnea) 2014   did not tolerate CPAP   Superficial vein thrombosis 01/2015   after achilles surgery    Past Surgical History:  Procedure Laterality Date   COLONOSCOPY  05/2014   int hem, rpt 10 yrs Adrian Moreno)   HIP SURGERY Left 2014   torn labrum   NASAL SEPTUM SURGERY  2014   s/p surgery x2 on same day with complications    Family History  Problem Relation Age of Onset   Cancer Father 39       prostate (normal PSAs) deceased at 4   Hypertension Father    Diabetes Father    CAD Neg Hx    Stroke Neg Hx    Heart failure Father     Social History   Socioeconomic History   Marital status: Single    Spouse name: Not on file   Number of children: Not on file   Years of education: Not on file   Highest education level: Not on file  Occupational History   Not on file  Tobacco Use   Smoking status: Never   Smokeless tobacco: Never  Substance and Sexual  Activity   Alcohol use: Yes    Alcohol/week: 0.0 standard drinks    Comment: occasional   Drug use: No   Sexual activity: Not on file  Other Topics Concern   Not on file  Social History Narrative   Lives with wife, 1 dog   Edu: some college   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 Negative for nausea and vomiting.  Musculoskeletal:  Positive for back pain.  All other systems reviewed and are negative.  Objective:   Today's Vitals: BP 137/90   Pulse 69   Temp 98 F (36.7 C) (Oral)   Resp 16   Ht 5\' 9"  (1.753 m)   Wt 188 lb 12.8 oz (85.6 kg)   SpO2 96%  BMI 27.88 kg/m   Physical Exam Vitals and nursing note reviewed.  Constitutional:      General: He is not in acute distress. Cardiovascular:     Rate and Rhythm: Normal rate and regular rhythm.  Pulmonary:     Effort: Pulmonary effort is normal.     Breath sounds: Normal breath sounds.  Abdominal:     Palpations: Abdomen is soft. There is no mass.     Tenderness: There is no abdominal tenderness.  Musculoskeletal:     Lumbar back: Tenderness present. No swelling or deformity. Decreased range of motion.  Neurological:     General: No focal deficit present.     Mental Status: He is alert and oriented to person, place, and time.    Assessment & Plan:   1. Midline low back pain without sciatica, unspecified chronicity Referral to ortho for further eval/mgt. Tylenol/nsiads prn. Tramadol prescribed - Ambulatory referral to Orthopedic Surgery  2.  Dyspepsia Carafate prescribed. Will monitor  3. Encounter to establish care     Outpatient Encounter Medications as of 11/24/2020  Medication Sig   albuterol (VENTOLIN HFA) 108 (90 Base) MCG/ACT inhaler Inhale 1-2 puffs into the lungs every 6 (six) hours as needed (cough). (Patient not taking: Reported on 11/24/2020)   benzonatate (TESSALON) 100 MG capsule Take 1 capsule (100 mg total) by mouth every 8 (eight) hours. (Patient not taking: Reported on 11/24/2020)   hydrOXYzine (ATARAX/VISTARIL) 25 MG tablet Take 0.5-1 tablets (12.5-25 mg total) by mouth 2 (two) times daily as needed for anxiety. (Patient not taking: Reported on 11/24/2020)   testosterone cypionate (DEPOTESTOSTERONE CYPIONATE) 200 MG/ML injection INJECT 0.5ML INTRAMUSCULARLY WEEKLY (Patient not taking: Reported on 11/24/2020)   No facility-administered encounter medications on file as of 11/24/2020.    Follow-up: Return in about 3 months (around 02/24/2021) for follow up, chronic med issues.   Adrian Raymond, MD

## 2020-11-24 NOTE — Progress Notes (Signed)
Patient is here to est care. Patient has had lower back pain x 1 month 5/10. Patient was seen at ER for pain and given pain medication but has decline to take.  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

## 2020-12-12 ENCOUNTER — Ambulatory Visit: Payer: BC Managed Care – PPO | Admitting: Physician Assistant

## 2021-02-24 ENCOUNTER — Encounter: Payer: BC Managed Care – PPO | Admitting: Family Medicine

## 2021-03-30 ENCOUNTER — Other Ambulatory Visit: Payer: Self-pay

## 2022-01-22 HISTORY — PX: HERNIA REPAIR: SHX51

## 2022-04-05 ENCOUNTER — Encounter: Payer: Self-pay | Admitting: Nurse Practitioner

## 2022-04-05 ENCOUNTER — Ambulatory Visit: Payer: Commercial Managed Care - PPO | Admitting: Nurse Practitioner

## 2022-04-05 ENCOUNTER — Encounter: Payer: Self-pay | Admitting: *Deleted

## 2022-04-05 VITALS — BP 108/68 | HR 67 | Temp 98.2°F | Resp 16 | Ht 69.0 in | Wt 194.4 lb

## 2022-04-05 DIAGNOSIS — N401 Enlarged prostate with lower urinary tract symptoms: Secondary | ICD-10-CM | POA: Diagnosis not present

## 2022-04-05 DIAGNOSIS — E291 Testicular hypofunction: Secondary | ICD-10-CM

## 2022-04-05 DIAGNOSIS — G4733 Obstructive sleep apnea (adult) (pediatric): Secondary | ICD-10-CM | POA: Diagnosis not present

## 2022-04-05 DIAGNOSIS — R7989 Other specified abnormal findings of blood chemistry: Secondary | ICD-10-CM | POA: Diagnosis not present

## 2022-04-05 DIAGNOSIS — D751 Secondary polycythemia: Secondary | ICD-10-CM

## 2022-04-05 DIAGNOSIS — Z8619 Personal history of other infectious and parasitic diseases: Secondary | ICD-10-CM

## 2022-04-05 DIAGNOSIS — E663 Overweight: Secondary | ICD-10-CM

## 2022-04-05 DIAGNOSIS — Z85828 Personal history of other malignant neoplasm of skin: Secondary | ICD-10-CM

## 2022-04-05 DIAGNOSIS — R351 Nocturia: Secondary | ICD-10-CM

## 2022-04-05 MED ORDER — TAMSULOSIN HCL 0.4 MG PO CAPS: 0.4000 mg | ORAL_CAPSULE | Freq: Every day | ORAL | 0 refills | Status: DC

## 2022-04-05 NOTE — Assessment & Plan Note (Signed)
History of the same did feel enlarged on exam.  Will start patient on tamsulosin 0.4 mg nightly

## 2022-04-05 NOTE — Assessment & Plan Note (Signed)
Historical diagnosis pending vitamin B12 level

## 2022-04-05 NOTE — Assessment & Plan Note (Signed)
Most likely due to testosterone use.  Pending CBC today

## 2022-04-05 NOTE — Progress Notes (Signed)
New Patient Office Visit  Subjective    Patient ID: Adrian Moreno, male    DOB: Jan 19, 1971  Age: 52 y.o. MRN: [id number]  CC:  Chief Complaint  Patient presents with   Establish Care    HPI Adrian Moreno presents to establish care   OSA: States that he did a study and had apnea. States that he apnea and  could not tolerate. Since then repeat studies and ok   Hypogonadism: States that he has been on and off them for the late 8s. States that he has done the topical and injections. Tolerated it well. States that his sex drive is declined and decreased energy. States that he sleeps well and wakes up good. Feels sluggish like walking through water. States that he has been off for 2-3 years.    BPH: States that he has a history of the same. States he has been on medication in the past. States that his stream  is very light. States that he does go to the bathroom frequently at night.  Incomplete emptying   Polycythemia: Historical diagnosis likely secondary to testosterone use. B12/Vitmain D: Patient states he used to be on over-the-counter supplementation has not been on it for some time.  Colonoscopy 2016, States that he did a colonoscopy along with endoscopy last year to the [place]. PSA: this year through the [place] per patient report.  His father did have prostate cancer with all normal PSAs at age 35.  [id number] Flu: 2019 Covid:  refused  Shingles: Information discussed in office    Outpatient Encounter Medications as of 04/05/2022  Medication Sig   tamsulosin (FLOMAX) 0.4 MG CAPS capsule Take 1 capsule (0.4 mg total) by mouth daily.   albuterol (VENTOLIN HFA) 108 (90 Base) MCG/ACT inhaler Inhale 1-2 puffs into the lungs every 6 (six) hours as needed (cough). (Patient not taking: Reported on 11/24/2020)   benzonatate (TESSALON) 100 MG capsule Take 1 capsule (100 mg total) by mouth every 8 (eight) hours. (Patient not taking: Reported on 11/24/2020)   hydrOXYzine (ATARAX/VISTARIL) 25 MG  tablet Take 0.5-1 tablets (12.5-25 mg total) by mouth 2 (two) times daily as needed for anxiety. (Patient not taking: Reported on 11/24/2020)   testosterone cypionate (DEPOTESTOSTERONE CYPIONATE) 200 MG/ML injection INJECT 0.5ML INTRAMUSCULARLY WEEKLY (Patient not taking: Reported on 11/24/2020)   No facility-administered encounter medications on file as of 04/05/2022.    Past Medical History:  Diagnosis Date   BPH (benign prostatic hypertrophy)    Wrenn   Cancer [facility])    Basal cell on face   Hypogonadism in male 04/22/2014   OSA (obstructive sleep apnea) 01/23/2012   did not tolerate CPAP   Superficial vein thrombosis 01/23/2015   after achilles surgery    Past Surgical History:  Procedure Laterality Date   COLONOSCOPY  05/2014   int hem, rpt 10 yrs Amedeo Plenty)   HIP SURGERY Left 2014   torn labrum   NASAL SEPTUM SURGERY  2014   s/p surgery x2 on same day with complications    Family History  Problem Relation Age of Onset   Cancer Father 67       prostate (normal PSAs) deceased at 69   Hypertension Father    Diabetes Father    Heart failure Father    CAD Neg Hx    Stroke Neg Hx     Social History   Socioeconomic History   Marital status: Married    Spouse name: Otila Kluver   Number of children: 4  Years of education: Not on file   Highest education level: Not on file  Occupational History   Not on file  Tobacco Use   Smoking status: Never   Smokeless tobacco: Never  Vaping Use   Vaping Use: Never used  Substance and Sexual Activity   Alcohol use: Yes    Alcohol/week: 0.0 standard drinks of alcohol    Comment: occasional   Drug use: No   Sexual activity: Not on file  Other Topics Concern   Not on file  Social History Narrative   Fulltime: [employer]      Florencia Reasons 32   Sarah 28   Tara 28   Hannah 27      Hobbies:  woodworking and playing guitar    Social Determinants of Health   Financial Resource Strain: Not on [employer] Insecurity: Not on file   Transportation Needs: Not on file  Physical Activity: Not on file  Stress: Not on file  Social Connections: Not on file  Intimate Partner Violence: Not on file    Review of Systems  Constitutional:  Negative for chills and fever.  Respiratory:  Negative for shortness of breath.   Cardiovascular:  Negative for chest pain.  Gastrointestinal:  Negative for abdominal pain, constipation, diarrhea, nausea and vomiting.       BM daily   Genitourinary:  Negative for dysuria and hematuria.  Neurological:  Negative for headaches.  Psychiatric/Behavioral:  Negative for hallucinations and suicidal ideas.         Objective    BP 108/68   Pulse 67   Temp 98.2 F (36.8 C)   Resp 16   Ht '5\' 9"'$  (1.753 m)   Wt 194 lb 6 oz (88.2 kg)   SpO2 97%   BMI 28.70 kg/m   Physical Exam Vitals and nursing note reviewed.  Constitutional:      Appearance: Normal appearance.  HENT:     Right Ear: Tympanic membrane, ear canal and external ear normal.     Left Ear: Tympanic membrane, ear canal and external ear normal.     Mouth/Throat:     Mouth: Mucous membranes are moist.     Pharynx: Oropharynx is clear.  Eyes:     Extraocular Movements: Extraocular movements intact.     Pupils: Pupils are equal, round, and reactive to light.  Cardiovascular:     Rate and Rhythm: Normal rate and regular rhythm.     Pulses: Normal pulses.     Heart sounds: Normal heart sounds.  Pulmonary:     Effort: Pulmonary effort is normal.     Breath sounds: Normal breath sounds.  Genitourinary:    Prostate: Enlarged. Not tender and no nodules present.  Musculoskeletal:     Right lower leg: No edema.     Left lower leg: No edema.  Lymphadenopathy:     Cervical: No cervical adenopathy.  Skin:    General: Skin is warm.  Neurological:     General: No focal deficit present.     Mental Status: He is alert.     Deep Tendon Reflexes:     Reflex Scores:      Bicep reflexes are 2+ on the right side and 2+ on the left  side.      Patellar reflexes are 2+ on the right side and 2+ on the left side.    Comments: Bilateral upper and lower extremity strength 5/5  Psychiatric:        Mood and  Affect: Mood normal.        Behavior: Behavior normal.        Thought Content: Thought content normal.        Judgment: Judgment normal.         Assessment & Plan:   Problem List Items Addressed This Visit       Respiratory   OSA (obstructive sleep apnea)    Patient has a history of OSA and could not tolerate CPAP.  Patient states he had several sleep studies since then and no longer needs any type of device for sleep apnea.      Relevant Orders   CBC   TSH     Endocrine   Hypogonadism in male - Primary    Historical diagnosis per patient report.  Has been on and off of testosterone replacement for years since his 79s per patient report.  Pending testosterone level and PSA today      Relevant Orders   CBC   Comprehensive metabolic panel   Testosterone   PSA     Genitourinary   Benign prostatic hyperplasia    History of the same did feel enlarged on exam.  Will start patient on tamsulosin 0.4 mg nightly      Relevant Medications   tamsulosin (FLOMAX) 0.4 MG CAPS capsule     Other   Overweight    Check labs inclusive of A1c and lipid panel      Relevant Orders   Hemoglobin A1c   Lipid panel   Low vitamin B12 level    Historical diagnosis pending vitamin B12 level      Relevant Orders   Vitamin B12   Low serum vitamin D    Historical diagnosis pending vitamin D level      Relevant Orders   VITAMIN D 25 Hydroxy (Vit-D Deficiency, Fractures)   Polycythemia, secondary    Most likely due to testosterone use.  Pending CBC today      Other Visit Diagnoses     History of skin cancer       Relevant Orders   Ambulatory referral to Dermatology   History of Helicobacter pylori infection       Relevant Orders   Helicobacter Pylori Special Antigen, Stool       Return for TBD lab  appt.   Romilda Garret, NP

## 2022-04-05 NOTE — Assessment & Plan Note (Signed)
Check labs inclusive of A1c and lipid panel

## 2022-04-05 NOTE — Assessment & Plan Note (Signed)
Historical diagnosis pending vitamin D level

## 2022-04-05 NOTE — Assessment & Plan Note (Signed)
Patient has a history of OSA and could not tolerate CPAP.  Patient states he had several sleep studies since then and no longer needs any type of device for sleep apnea.

## 2022-04-05 NOTE — Patient Instructions (Signed)
Nice to see you today Make a lab appointment when you leave. Needs to be before 9am. Come fasting as I will check your cholesterol Follow up will depend on labs

## 2022-04-05 NOTE — Assessment & Plan Note (Signed)
Historical diagnosis per patient report.  Has been on and off of testosterone replacement for years since his 23s per patient report.  Pending testosterone level and PSA today

## 2022-04-06 ENCOUNTER — Other Ambulatory Visit (INDEPENDENT_AMBULATORY_CARE_PROVIDER_SITE_OTHER): Payer: Commercial Managed Care - PPO

## 2022-04-06 DIAGNOSIS — R7989 Other specified abnormal findings of blood chemistry: Secondary | ICD-10-CM

## 2022-04-06 DIAGNOSIS — E291 Testicular hypofunction: Secondary | ICD-10-CM | POA: Diagnosis not present

## 2022-04-06 DIAGNOSIS — E663 Overweight: Secondary | ICD-10-CM

## 2022-04-06 DIAGNOSIS — G4733 Obstructive sleep apnea (adult) (pediatric): Secondary | ICD-10-CM | POA: Diagnosis not present

## 2022-04-06 LAB — CBC
HCT: 47.7 % (ref 39.0–52.0)
Hemoglobin: 16 g/dL (ref 13.0–17.0)
MCHC: 33.6 g/dL (ref 30.0–36.0)
MCV: 93.8 fl (ref 78.0–100.0)
Platelets: 175 10*3/uL (ref 150.0–400.0)
RBC: 5.08 Mil/uL (ref 4.22–5.81)
RDW: 13.2 % (ref 11.5–15.5)
WBC: 5.8 10*3/uL (ref 4.0–10.5)

## 2022-04-06 LAB — LIPID PANEL
Cholesterol: 162 mg/dL (ref 0–200)
HDL: 42.4 mg/dL (ref >39.00)
LDL Cholesterol: 88 mg/dL (ref 0–99)
NonHDL: 119.12
Total CHOL/HDL Ratio: 4
Triglycerides: 154 mg/dL — ABNORMAL HIGH (ref 0.0–149.0)
VLDL: 30.8 mg/dL (ref 0.0–40.0)

## 2022-04-06 LAB — COMPREHENSIVE METABOLIC PANEL
ALT: 15 U/L (ref 0–53)
AST: 14 U/L (ref 0–37)
Albumin: 3.9 g/dL (ref 3.5–5.2)
Alkaline Phosphatase: 55 U/L (ref 39–117)
BUN: 14 mg/dL (ref 6–23)
CO2: 25 mEq/L (ref 19–32)
Calcium: 9.4 mg/dL (ref 8.4–10.5)
Chloride: 107 mEq/L (ref 96–112)
Creatinine, Ser: 0.78 mg/dL (ref 0.40–1.50)
GFR: 102.35 mL/min (ref >60.00)
Glucose, Bld: 105 mg/dL — ABNORMAL HIGH (ref 70–99)
Potassium: 4.1 mEq/L (ref 3.5–5.1)
Sodium: 141 mEq/L (ref 135–145)
Total Bilirubin: 0.9 mg/dL (ref 0.2–1.2)
Total Protein: 6.3 g/dL (ref 6.0–8.3)

## 2022-04-06 LAB — HEMOGLOBIN A1C: Hgb A1c MFr Bld: 5.5 % (ref 4.6–6.5)

## 2022-04-06 LAB — PSA: PSA: 0.64 ng/mL (ref 0.10–4.00)

## 2022-04-06 LAB — TESTOSTERONE: Testosterone: 278.21 ng/dL — ABNORMAL LOW (ref 300.00–890.00)

## 2022-04-06 LAB — VITAMIN B12: Vitamin B-12: 248 pg/mL (ref 211–911)

## 2022-04-06 LAB — TSH: TSH: 1.27 u[IU]/mL (ref 0.35–5.50)

## 2022-04-06 LAB — VITAMIN D 25 HYDROXY (VIT D DEFICIENCY, FRACTURES): VITD: 10.35 ng/mL — ABNORMAL LOW (ref 30.00–100.00)

## 2022-04-09 ENCOUNTER — Other Ambulatory Visit: Payer: Self-pay | Admitting: Nurse Practitioner

## 2022-04-09 DIAGNOSIS — E291 Testicular hypofunction: Secondary | ICD-10-CM

## 2022-04-09 DIAGNOSIS — R7989 Other specified abnormal findings of blood chemistry: Secondary | ICD-10-CM

## 2022-04-09 MED ORDER — VITAMIN D (ERGOCALCIFEROL) 1.25 MG (50000 UNIT) PO CAPS
50000.0000 [IU] | ORAL_CAPSULE | ORAL | 0 refills | Status: DC
Start: 2022-04-09 — End: 2023-01-21

## 2022-04-09 MED ORDER — TESTOSTERONE CYPIONATE 200 MG/ML IM SOLN: 100.0000 mg | INTRAMUSCULAR | 0 refills | Status: DC

## 2022-04-13 ENCOUNTER — Telehealth: Payer: Self-pay | Admitting: Nurse Practitioner

## 2022-04-13 NOTE — Telephone Encounter (Signed)
Patient returned call regarding lab results and he would like to know if rx for testosterone can be called in so that he can give them to hisself?Please advise.

## 2022-04-13 NOTE — Telephone Encounter (Signed)
Spoke to pt about results see previous lab results.

## 2022-05-06 IMAGING — DX DG CHEST 2V
2 series · 2 of 2 positions shown · non-contrast
Comparison: None.

CLINICAL DATA: Cough for 10 days. Shortness of breath.

EXAM:
CHEST - 2 VIEW

[chest pa]
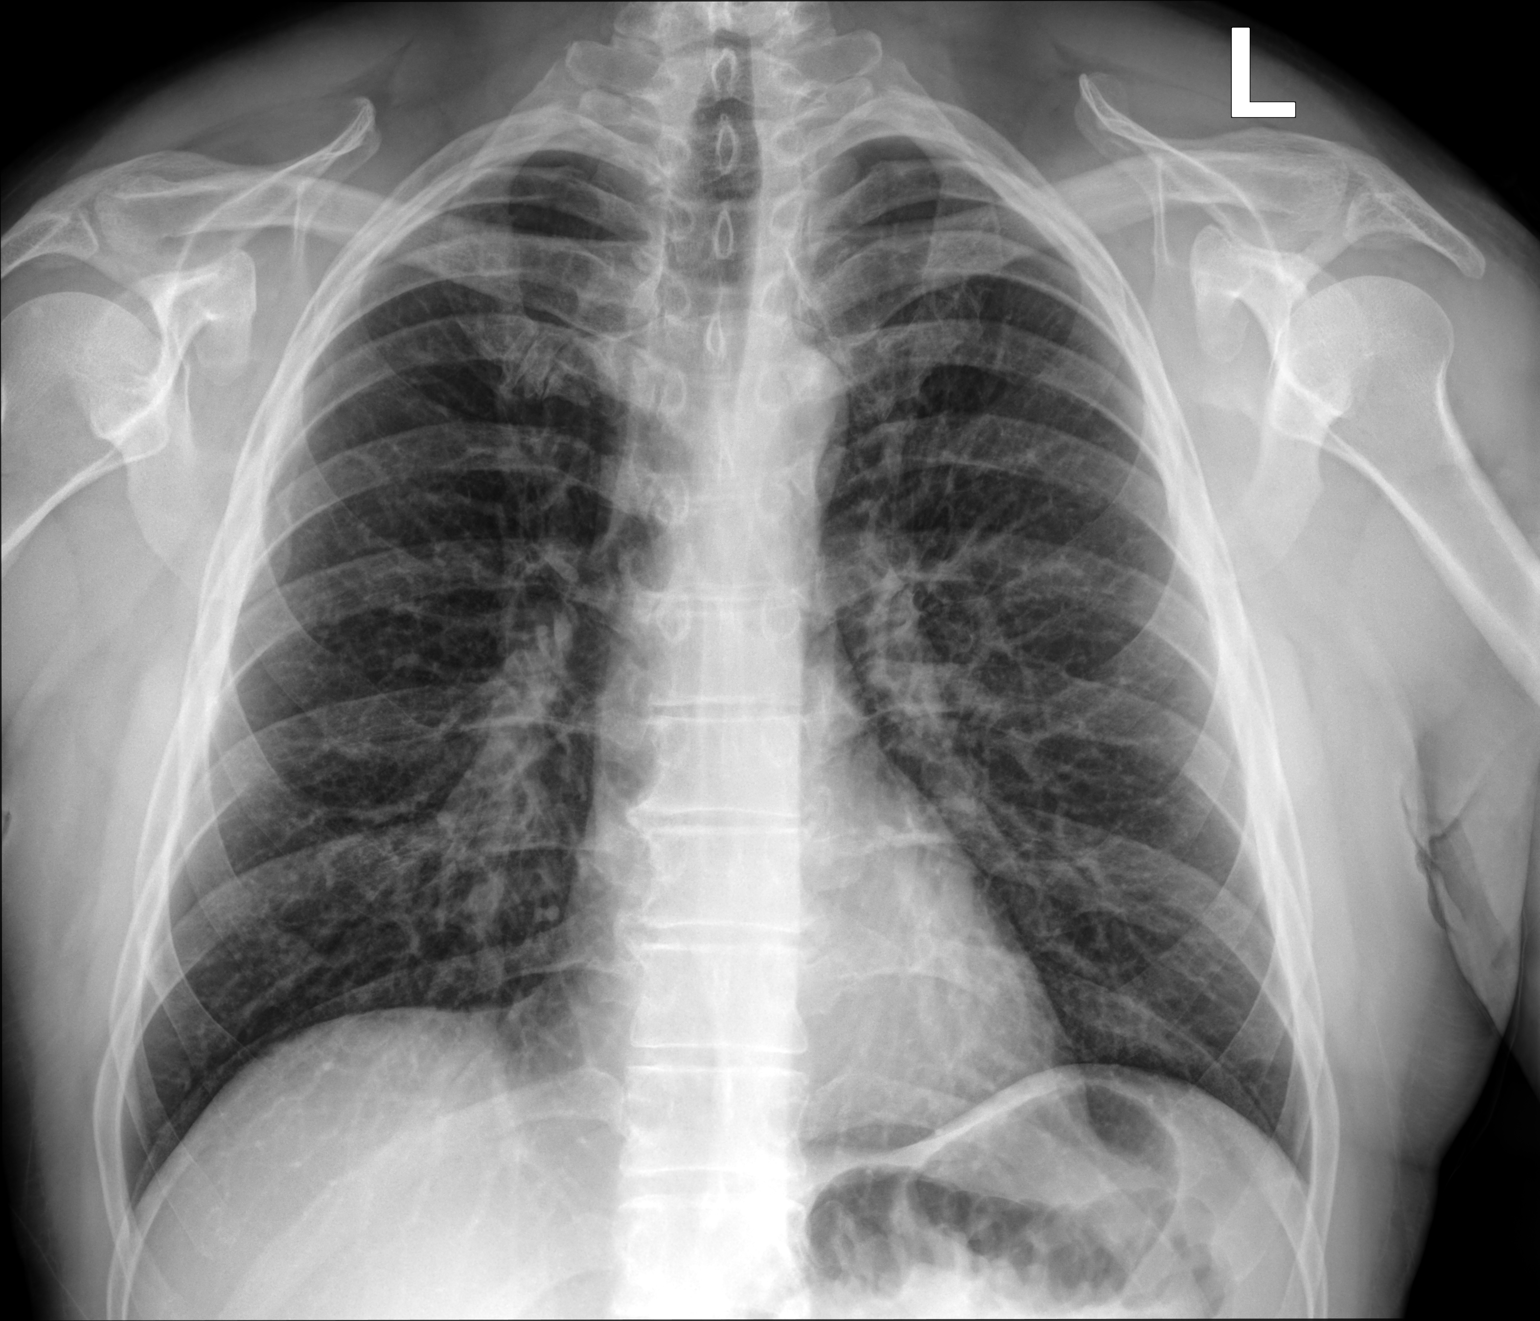

[chest lat]
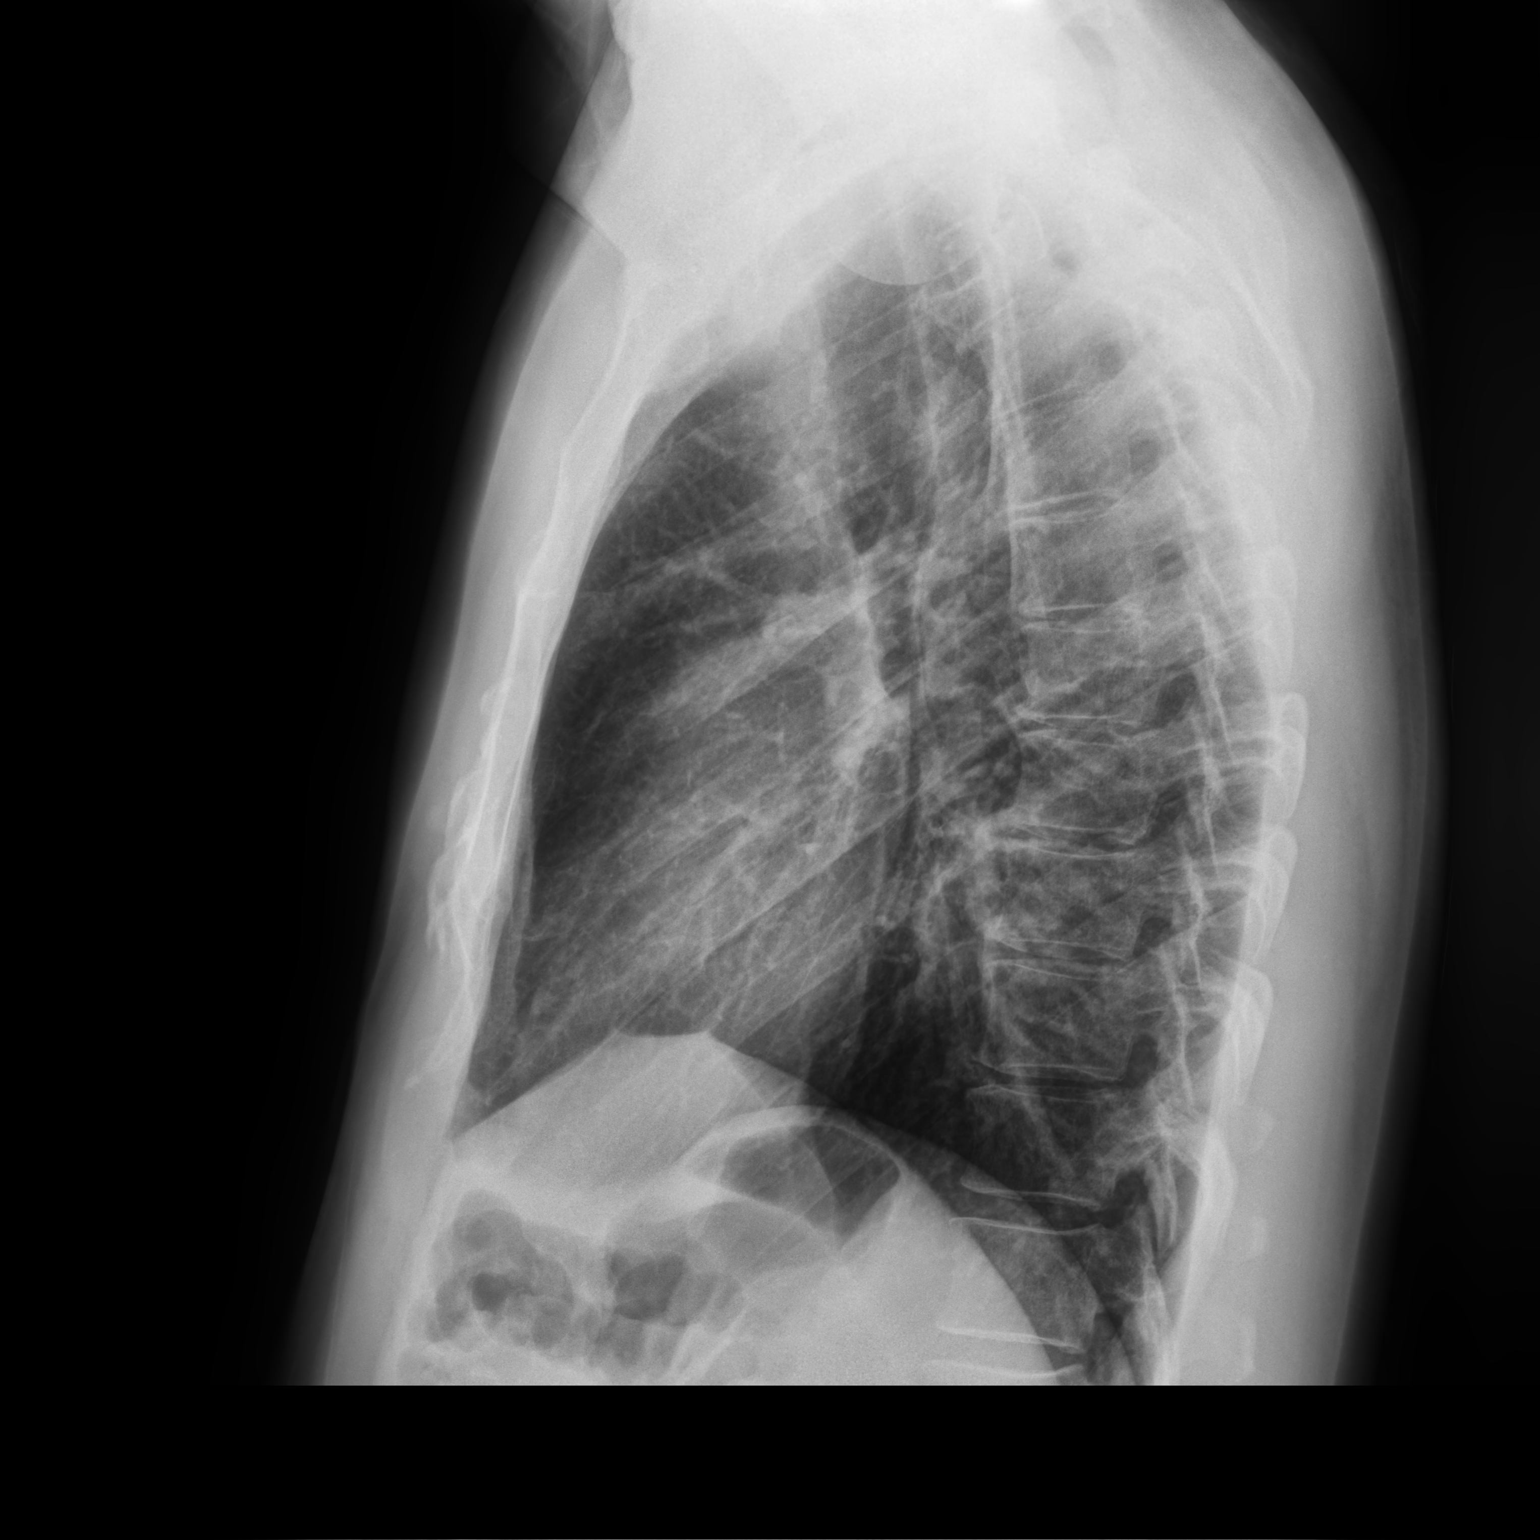

[2 of 2 positions shown; findings below may reference images not displayed]

FINDINGS: Heart size is normal. Interstitial coarsening appears chronic. No
focal airspace disease is present. No edema or effusion is present.
IMPRESSION: 1. No acute cardiopulmonary disease.
2. Chronic interstitial coarsening.

## 2022-05-17 ENCOUNTER — Other Ambulatory Visit: Payer: Self-pay

## 2022-05-17 ENCOUNTER — Other Ambulatory Visit: Payer: Commercial Managed Care - PPO

## 2022-05-17 DIAGNOSIS — Z8619 Personal history of other infectious and parasitic diseases: Secondary | ICD-10-CM

## 2022-05-18 LAB — HELICOBACTER PYLORI  SPECIAL ANTIGEN
MICRO NUMBER:: 14873998
RESULT:: DETECTED — AB
SPECIMEN QUALITY: ADEQUATE

## 2022-05-21 ENCOUNTER — Telehealth: Payer: Self-pay

## 2022-05-21 ENCOUNTER — Other Ambulatory Visit: Payer: Self-pay | Admitting: Nurse Practitioner

## 2022-05-21 DIAGNOSIS — A048 Other specified bacterial intestinal infections: Secondary | ICD-10-CM

## 2022-05-21 MED ORDER — CLARITHROMYCIN 500 MG PO TABS: 500.0000 mg | ORAL_TABLET | Freq: Two times a day (BID) | ORAL | 0 refills | Status: AC

## 2022-05-21 MED ORDER — AMOXICILLIN 500 MG PO CAPS: 1000.0000 mg | ORAL_CAPSULE | Freq: Two times a day (BID) | ORAL | 0 refills | Status: AC

## 2022-05-21 MED ORDER — OMEPRAZOLE 20 MG PO CPDR: 20.0000 mg | DELAYED_RELEASE_CAPSULE | Freq: Two times a day (BID) | ORAL | 0 refills | Status: DC

## 2022-05-21 NOTE — Telephone Encounter (Signed)
Can we get a PA for testosterone cypionate (DEPOTESTOSTERONE CYPIONATE) 200 MG/ML injection

## 2022-05-23 ENCOUNTER — Telehealth: Payer: Self-pay

## 2022-05-23 ENCOUNTER — Other Ambulatory Visit (HOSPITAL_COMMUNITY): Payer: Self-pay

## 2022-05-23 NOTE — Telephone Encounter (Signed)
Patient has been notified

## 2022-05-23 NOTE — Telephone Encounter (Signed)
PA approved. Effective: 05/23/22 - 05/22/25

## 2022-05-23 NOTE — Telephone Encounter (Signed)
Prior authorization for Testosterone Cypionate 200MG /ML intramuscular solution submitted and APPROVED. Test billing returns $5.00 copay for 28 day supply.  Key [id number] Effective: 05/23/22 - 05/22/25

## 2022-06-07 ENCOUNTER — Other Ambulatory Visit: Payer: Self-pay | Admitting: Nurse Practitioner

## 2022-06-07 DIAGNOSIS — A048 Other specified bacterial intestinal infections: Secondary | ICD-10-CM

## 2022-06-07 DIAGNOSIS — R7989 Other specified abnormal findings of blood chemistry: Secondary | ICD-10-CM

## 2022-07-05 ENCOUNTER — Other Ambulatory Visit: Payer: Self-pay | Admitting: Nurse Practitioner

## 2022-07-05 DIAGNOSIS — R7989 Other specified abnormal findings of blood chemistry: Secondary | ICD-10-CM

## 2022-07-05 DIAGNOSIS — E291 Testicular hypofunction: Secondary | ICD-10-CM

## 2022-07-06 ENCOUNTER — Other Ambulatory Visit: Payer: Self-pay | Admitting: Nurse Practitioner

## 2022-07-06 DIAGNOSIS — N401 Enlarged prostate with lower urinary tract symptoms: Secondary | ICD-10-CM

## 2022-07-06 MED ORDER — TAMSULOSIN HCL 0.4 MG PO CAPS: 0.4000 mg | ORAL_CAPSULE | Freq: Every day | ORAL | 1 refills | Status: DC

## 2022-07-06 NOTE — Telephone Encounter (Deleted)
Needs a CPE ASAP

## 2022-07-13 ENCOUNTER — Other Ambulatory Visit (INDEPENDENT_AMBULATORY_CARE_PROVIDER_SITE_OTHER): Payer: Commercial Managed Care - PPO

## 2022-07-13 DIAGNOSIS — R7989 Other specified abnormal findings of blood chemistry: Secondary | ICD-10-CM | POA: Diagnosis not present

## 2022-07-13 DIAGNOSIS — E291 Testicular hypofunction: Secondary | ICD-10-CM | POA: Diagnosis not present

## 2022-07-13 LAB — CBC
HCT: 52.6 % — ABNORMAL HIGH (ref 39.0–52.0)
Hemoglobin: 17.4 g/dL — ABNORMAL HIGH (ref 13.0–17.0)
MCHC: 33.1 g/dL (ref 30.0–36.0)
MCV: 95.7 fl (ref 78.0–100.0)
Platelets: 172 10*3/uL (ref 150.0–400.0)
RBC: 5.49 Mil/uL (ref 4.22–5.81)
RDW: 13.3 % (ref 11.5–15.5)
WBC: 6.4 10*3/uL (ref 4.0–10.5)

## 2022-07-13 LAB — TESTOSTERONE: Testosterone: 454.62 ng/dL (ref 300.00–890.00)

## 2022-07-13 LAB — VITAMIN D 25 HYDROXY (VIT D DEFICIENCY, FRACTURES): VITD: 37.07 ng/mL (ref 30.00–100.00)

## 2022-07-16 ENCOUNTER — Ambulatory Visit (INDEPENDENT_AMBULATORY_CARE_PROVIDER_SITE_OTHER): Payer: Commercial Managed Care - PPO | Admitting: Nurse Practitioner

## 2022-07-16 ENCOUNTER — Encounter: Payer: Self-pay | Admitting: Nurse Practitioner

## 2022-07-16 VITALS — BP 120/66 | HR 65 | Temp 97.9°F | Resp 16 | Ht 69.0 in | Wt 197.1 lb

## 2022-07-16 DIAGNOSIS — Z8619 Personal history of other infectious and parasitic diseases: Secondary | ICD-10-CM | POA: Diagnosis not present

## 2022-07-16 DIAGNOSIS — D751 Secondary polycythemia: Secondary | ICD-10-CM | POA: Diagnosis not present

## 2022-07-16 DIAGNOSIS — K529 Noninfective gastroenteritis and colitis, unspecified: Secondary | ICD-10-CM | POA: Insufficient documentation

## 2022-07-16 NOTE — Assessment & Plan Note (Signed)
History of the same.  Patient did finish triple therapy pending stool culture to see if eradicated or not

## 2022-07-16 NOTE — Patient Instructions (Signed)
Nice to see you today I will be in touch with the labs once I have the results and have reviewed them Follow up with a lab visit in three months, sooner if you need me   I recommend starting a probiotic AFTER you have given me your stool sample. Algin is a good one or you can check with the pharmacist and get other options if needed

## 2022-07-16 NOTE — Progress Notes (Signed)
Established Patient Office Visit  Subjective   Patient ID: Adrian Moreno, male    DOB: 01/01/1971  Age: 52 y.o. MRN: [id number]  Chief Complaint  Patient presents with   GI Problem    4-8 times a day since he has finished the medication for H. pylori      Abdominal concerns: patient was seen by me on 04/05/2022. He had a history of H pylori. Stool sample was sent off and came back positive patient was treated with triple therapy. Patient states that he did well for 2 weeks off the medication and then since then has been having frequent bowel movements. 4-8 times a day   States that he finished taking the antibiotics. State that he was on the omeprazole and it caused him to go to the bathroom. Staets that he will go 7-8 times a day. Has a history where he was treated at the [place] in the past and it was eradicated.   States that he is not having any belly pain. He is having some urgency and frequency.  [facility]     Review of Systems  Constitutional:  Positive for weight loss. Negative for chills, fever and malaise/fatigue.  Respiratory:  Negative for shortness of breath.   Cardiovascular:  Negative for chest pain.  Gastrointestinal:  Positive for diarrhea. Negative for abdominal pain, blood in stool, constipation, nausea and vomiting.      Objective:     BP 120/66   Pulse 65   Temp 97.9 F (36.6 C)   Resp 16   Ht 5\' 9"  (1.753 m)   Wt 197 lb 2 oz (89.4 kg)   SpO2 97%   BMI 29.11 kg/m  BP Readings from Last 3 Encounters:  07/16/22 120/66  04/05/22 108/68  11/24/20 137/90   Wt Readings from Last 3 Encounters:  07/16/22 197 lb 2 oz (89.4 kg)  04/05/22 194 lb 6 oz (88.2 kg)  11/24/20 188 lb 12.8 oz (85.6 kg)      Physical Exam Vitals and nursing note reviewed.  Constitutional:      Appearance: Normal appearance.  Cardiovascular:     Rate and Rhythm: Normal rate and regular rhythm.     Heart sounds: Normal heart sounds.   Pulmonary:     Effort: Pulmonary effort is normal.     Breath sounds: Normal breath sounds.  Abdominal:     General: Bowel sounds are normal. There is no distension.     Palpations: There is no mass.     Tenderness: There is no abdominal tenderness.     Hernia: No hernia is present.  Neurological:     Mental Status: He is alert.      No results found for any visits on 07/16/22.    The 10-year ASCVD risk score (Arnett DK, et al., 2019) is: 3.7%    Assessment & Plan:   Problem List Items Addressed This Visit       Other   Polycythemia, secondary    Patient recently had labs drawn that showed polycythemia secondary to testosterone replacement.  Patient plans on giving blood recommended at least twice a year.  Patient follow-up in 3 months for a CBC recheck      History of Helicobacter pylori infection    History of the same.  Patient did finish triple therapy pending stool culture to see if eradicated or not      Relevant Orders  C. difficile GDH and Toxin A/B   Helicobacter Pylori Special Antigen, Stool   Frequent stools - Primary    Frequent incomplete broken stools.  Given that he was recently treated with 2 antibiotics for H. pylori eradication will check stool for C. difficile.  Pending result did recommend patient getting a probiotic after he collects his samples to see if this will help normalize his flora      Relevant Orders   C. difficile GDH and Toxin A/B    Return if symptoms worsen or fail to improve.    Audria Nine, NP

## 2022-07-16 NOTE — Assessment & Plan Note (Signed)
Patient recently had labs drawn that showed polycythemia secondary to testosterone replacement.  Patient plans on giving blood recommended at least twice a year.  Patient follow-up in 3 months for a CBC recheck

## 2022-07-16 NOTE — Assessment & Plan Note (Signed)
Frequent incomplete broken stools.  Given that he was recently treated with 2 antibiotics for H. pylori eradication will check stool for C. difficile.  Pending result did recommend patient getting a probiotic after he collects his samples to see if this will help normalize his flora

## 2022-07-17 ENCOUNTER — Other Ambulatory Visit: Payer: Self-pay | Admitting: Nurse Practitioner

## 2022-07-17 DIAGNOSIS — D751 Secondary polycythemia: Secondary | ICD-10-CM

## 2022-07-18 ENCOUNTER — Other Ambulatory Visit: Payer: Self-pay | Admitting: Radiology

## 2022-07-18 DIAGNOSIS — Z8619 Personal history of other infectious and parasitic diseases: Secondary | ICD-10-CM

## 2022-07-18 DIAGNOSIS — K529 Noninfective gastroenteritis and colitis, unspecified: Secondary | ICD-10-CM

## 2022-07-19 LAB — C. DIFFICILE GDH AND TOXIN A/B
GDH ANTIGEN: NOT DETECTED
MICRO NUMBER:: 15130422
SPECIMEN QUALITY:: ADEQUATE
TOXIN A AND B: NOT DETECTED

## 2022-07-19 LAB — HELICOBACTER PYLORI  SPECIAL ANTIGEN
MICRO NUMBER:: 15130421
RESULT:: DETECTED — AB
SPECIMEN QUALITY: ADEQUATE

## 2022-07-20 ENCOUNTER — Other Ambulatory Visit: Payer: Self-pay | Admitting: Nurse Practitioner

## 2022-07-20 DIAGNOSIS — A048 Other specified bacterial intestinal infections: Secondary | ICD-10-CM

## 2022-07-24 ENCOUNTER — Encounter: Payer: Self-pay | Admitting: Infectious Diseases

## 2022-07-24 ENCOUNTER — Ambulatory Visit: Payer: Commercial Managed Care - PPO | Attending: Infectious Diseases | Admitting: Infectious Diseases

## 2022-07-24 VITALS — BP 133/82 | HR 66 | Temp 97.7°F | Wt 193.0 lb

## 2022-07-24 DIAGNOSIS — A048 Other specified bacterial intestinal infections: Secondary | ICD-10-CM | POA: Diagnosis not present

## 2022-07-24 DIAGNOSIS — B9681 Helicobacter pylori [H. pylori] as the cause of diseases classified elsewhere: Secondary | ICD-10-CM | POA: Diagnosis not present

## 2022-07-24 DIAGNOSIS — K2970 Gastritis, unspecified, without bleeding: Secondary | ICD-10-CM | POA: Insufficient documentation

## 2022-07-24 NOTE — Patient Instructions (Addendum)
You are here for hpylori positive stool - you have been treated twice with Amoxicillin/clarithromycin/PPI for 2 weeks X 2 times- last one was in April 2024- You are persistent yl positive for stool antigen on 6/20204. You have the option to be treated with bismuth /tetracycline and metronidazole. I am going to refer you to Dr.Anna ( GI) for management Also the diarrhea you have may not be related to hpylori Recommend checking HEPC and HIV test Avoid artifical sweeteners Food journal

## 2022-07-24 NOTE — Progress Notes (Signed)
NAME: Adrian Moreno  DOB: 1970/05/14  MRN: [id number]  Date/Time: 07/24/2022 8:42 AM    ? Treyvan Pugliano is a 52 y.o. male with a history of BPH, hpylori gastritis diagnosed by endoscopy in sept 2023 is referred to me for persistent Hpylori test being positive Pt has had frequent stools, not well formed since the past 3 -4 years. He had seen GI at [facility] in [id number] and underwent endoscopy and colonoscopy in sept 2023, The gastric biopsy was chronic gastritis with helicobacter pylori seen in the tissue HE was given  Amoxicillin/ clarithromycin and PPI for 2 weeks. HE then saw a new PCP Mordecai Maes on March 2024 and he ordered Helicobacter pylori stool antigen which came back positive.  Patient was given another 2 weeks of amoxicillin clarithromycin along with PPI on May 21, 2022.  Patient, completed treatment.  He was feeling better for 2 weeks after the treatment.  Then started having frequent bowel movements 4 to 8/day.  The last couple of stools are not formed Patient does not have any abdominal pain Does not see any blood in the stool He has had weight loss of about 10 to 15 pounds No nausea or vomiting No fever or chills No joint pain or rash His PCP send C. difficile toxin and GDH and that was negative on 07/16/2022.  Repeat Helicobacter was done and that was still positive.  I am asked to see the patient. Patient has not been to the GI again He drinks 5 to 6 cups of green tea with steam via His wife also had some diarrhea for a week or so.3 years ago at that time she had been diagnosed with COVID.  Past Medical History:  Diagnosis Date   BPH (benign prostatic hypertrophy)    Wrenn   Cancer [facility])    Basal cell on face   Hypogonadism in male 04/22/2014   OSA (obstructive sleep apnea) 01/23/2012   did not tolerate CPAP   Superficial vein thrombosis 01/23/2015   after achilles surgery    Past Surgical History:  Procedure Laterality Date   COLONOSCOPY  05/2014   int hem, rpt 10 yrs  Madilyn Fireman)   HIP SURGERY Left 2014   torn labrum   NASAL SEPTUM SURGERY  2014   s/p surgery x2 on same day with complications    Social History   Socioeconomic History   Marital status: Married    Spouse name: Inetta Fermo   Number of children: 4   Years of education: Not on file   Highest education level: Not on file  Occupational History   Not on file  Tobacco Use   Smoking status: Never   Smokeless tobacco: Never  Vaping Use   Vaping Use: Never used  Substance and Sexual Activity   Alcohol use: Yes    Alcohol/week: 0.0 standard drinks of alcohol    Comment: occasional   Drug use: No   Sexual activity: Not on file  Other Topics Concern   Not on file  Social History Narrative   Fulltime: [employer]      Nanci Pina [address] 27   Delice Bison 27   Dahlia Client 26      Hobbies:  woodworking and playing guitar    Social Determinants of Health   Financial Resource Strain: Not on [employer] Insecurity: Not on file  Transportation Needs: Not on file  Physical Activity: Not on file  Stress: Not on file  Social Connections: Not on file  Intimate  Partner Violence: Not on file    Family History  Problem Relation Age of Onset   Cancer Father 15       prostate (normal PSAs) deceased at 70   Hypertension Father    Diabetes Father    Heart failure Father    CAD Neg Hx    Stroke Neg Hx    No Known Allergies I? Current Outpatient Medications  Medication Sig Dispense Refill   albuterol (VENTOLIN HFA) 108 (90 Base) MCG/ACT inhaler Inhale 1-2 puffs into the lungs every 6 (six) hours as needed (cough). (Patient not taking: Reported on 11/24/2020) 6.7 g 0   benzonatate (TESSALON) 100 MG capsule Take 1 capsule (100 mg total) by mouth every 8 (eight) hours. (Patient not taking: Reported on 11/24/2020) 21 capsule 0   hydrOXYzine (ATARAX/VISTARIL) 25 MG tablet Take 0.5-1 tablets (12.5-25 mg total) by mouth 2 (two) times daily as needed for anxiety. (Patient not taking: Reported on 07/16/2022) 40  tablet 0   omeprazole (PRILOSEC) 20 MG capsule Take 1 capsule (20 mg total) by mouth 2 (two) times daily before a meal for 15 days. 30 capsule 0   tamsulosin (FLOMAX) 0.4 MG CAPS capsule Take 1 capsule (0.4 mg total) by mouth daily. 90 capsule 1   testosterone cypionate (DEPOTESTOSTERONE CYPIONATE) 200 MG/ML injection INJECT 0.5 MLS (100 MG TOTAL) INTO THE MUSCLE EVERY 14 (FOURTEEN) DAYS. (SINGLE USE VIALS) 2 mL 0   Vitamin D, Ergocalciferol, (DRISDOL) 1.25 MG (50000 UNIT) CAPS capsule Take 1 capsule (50,000 Units total) by mouth every 7 (seven) days. 12 capsule 0   No current facility-administered medications for this visit.     Abtx:  Anti-infectives (From admission, onward)    None       REVIEW OF SYSTEMS:  Const: negative fever, negative chills,  weight loss of 15 pounds Eyes: negative diplopia or visual changes, negative eye pain ENT: negative coryza, negative sore throat Resp: negative cough, hemoptysis, dyspnea Cards: negative for chest pain, palpitations, lower extremity edema GU: negative for frequency, dysuria and hematuria GI: Negative for abdominal pain, bleeding, constipation Skin: negative for rash and pruritus Heme: negative for easy bruising and gum/nose bleeding MS: negative for myalgias, arthralgias, back pain and muscle weakness Neurolo:negative for headaches, dizziness, vertigo, memory problems  Psych: negative for feelings of anxiety, depression  Endocrine: negative for thyroid, diabetes Allergy/Immunology- negative for any medication or food allergies ? Patient until 2 years ago used to drink a lot of beer every night. Non-smoker  Objective:  VITALS:  BP 133/82   Pulse 66   Temp 97.7 F (36.5 C) (Temporal)   Wt 193 lb (87.5 kg)   BMI 28.50 kg/m    PHYSICAL EXAM:  General: Alert, cooperative, no distress, appears stated age.  Head: Normocephalic, without obvious abnormality, atraumatic. Eyes: Conjunctivae clear, anicteric sclerae. Pupils are  equal ENT Nares normal. No drainage or sinus tenderness. Lips, mucosa, and tongue normal. No Thrush Neck: Supple, symmetrical, no adenopathy, thyroid: non tender no carotid bruit and no JVD. Back: No CVA tenderness. Lungs: Clear to auscultation bilaterally. No Wheezing or Rhonchi. No rales. Heart: Regular rate and rhythm, no murmur, rub or gallop. Abdomen: Soft, non-tender,not distended. Bowel sounds normal. No masses Extremities: atraumatic, no cyanosis. No edema. No clubbing Skin: No rashes or lesions. Or bruising Lymph: Cervical, supraclavicular normal. Neurologic: Grossly non-focal Pertinent Labs Lab Results CBC    Component Value Date/Time   WBC 6.4 07/13/2022 0743   RBC 5.49 07/13/2022 0743   HGB 17.4 (  H) 07/13/2022 0743   HGB 18.6 06/24/2015 0000   HCT 52.6 (H) 07/13/2022 0743   HCT 56 06/24/2015 0000   PLT 172.0 07/13/2022 0743   MCV 95.7 07/13/2022 0743   MCHC 33.1 07/13/2022 0743   RDW 13.3 07/13/2022 0743   LYMPHSABS 3.1 04/22/2014 1629   MONOABS 0.5 04/22/2014 1629   EOSABS 0.2 04/22/2014 1629   BASOSABS 0.0 04/22/2014 1629   07/18/22 Stool antigen positive for H pylori 05/17/22 -stool antigen positive for H. pylori ? Impression/Recommendation Persistent H. pylori positive stool antigen versus recurrent positive stool antigen. Patient's symptoms are not of gastritis or dyspepsia. He has had frequent stools 6 to 8/day with unformed stools at the end of the day. This is less likely to be due to H. pylori The last test done on July 18, 2022 is positive for H. pylori stool antigen. He has had 2 courses of amoxicillin plus clarithromycin plus PPI for 2 weeks each time. The second course which was in April 2024 at cause more diarrhea. The options now for him would be bismuth plus tetracycline plus metronidazole which is a pill called Pylera.  But before prescribing that medication it may be worth referring him to GI as his symptoms of diarrhea are unlikely to be due to  H. pylori Also GI may choose to do another endoscopy to check for H. pylori culture and susceptibility to the different antimicrobials.   Or they may choose to just treat him with a course of Pylera.  Other causes of frequent stools include artificial sweeteners, irritable bowel syndrome, pancreatic insufficiency, and infrequent causes like celiac Will refer him to GI Dr. Tobi Bastos for further workup and management.  Hep C and HIV to be checked at least once he will discuss with his PCP during his next visit when he is having his lab draws and to include those test  Referral given for GI Discussed in great detail with the patient and his wife.  Patient has been told to avoid artificial sweeteners and also to maintain a food journal I will follow him as needed.  ________________________________________________  Note:  This document was prepared using Conservation officer, historic buildings and may include unintentional dictation errors.

## 2022-08-01 ENCOUNTER — Telehealth: Payer: Self-pay

## 2022-08-01 NOTE — Telephone Encounter (Signed)
Patient wife called regarding referral for GI. States that patient is scheduled for appointment on 09/11/22. Would like to know if provider is able to help move appointment up or if it would be okay to have appointment as scheduled.  Juanita Laster, RMA

## 2022-08-03 NOTE — Telephone Encounter (Signed)
Left voicemail with patient's wife regarding providers message.  Juanita Laster, RMA

## 2022-08-18 ENCOUNTER — Other Ambulatory Visit: Payer: Self-pay | Admitting: Nurse Practitioner

## 2022-08-18 DIAGNOSIS — E291 Testicular hypofunction: Secondary | ICD-10-CM

## 2022-09-06 ENCOUNTER — Other Ambulatory Visit: Payer: Self-pay

## 2022-09-06 DIAGNOSIS — F419 Anxiety disorder, unspecified: Secondary | ICD-10-CM | POA: Insufficient documentation

## 2022-09-06 DIAGNOSIS — B9681 Helicobacter pylori [H. pylori] as the cause of diseases classified elsewhere: Secondary | ICD-10-CM | POA: Insufficient documentation

## 2022-09-06 DIAGNOSIS — G4730 Sleep apnea, unspecified: Secondary | ICD-10-CM | POA: Insufficient documentation

## 2022-09-06 DIAGNOSIS — F32A Depression, unspecified: Secondary | ICD-10-CM | POA: Insufficient documentation

## 2022-09-06 DIAGNOSIS — Z711 Person with feared health complaint in whom no diagnosis is made: Secondary | ICD-10-CM | POA: Insufficient documentation

## 2022-09-06 DIAGNOSIS — K5900 Constipation, unspecified: Secondary | ICD-10-CM | POA: Insufficient documentation

## 2022-09-06 DIAGNOSIS — R6889 Other general symptoms and signs: Secondary | ICD-10-CM | POA: Insufficient documentation

## 2022-09-06 DIAGNOSIS — M25571 Pain in right ankle and joints of right foot: Secondary | ICD-10-CM | POA: Insufficient documentation

## 2022-09-06 DIAGNOSIS — M545 Low back pain, unspecified: Secondary | ICD-10-CM | POA: Insufficient documentation

## 2022-09-06 DIAGNOSIS — H524 Presbyopia: Secondary | ICD-10-CM | POA: Insufficient documentation

## 2022-09-06 DIAGNOSIS — G4733 Obstructive sleep apnea (adult) (pediatric): Secondary | ICD-10-CM | POA: Insufficient documentation

## 2022-09-06 DIAGNOSIS — G4700 Insomnia, unspecified: Secondary | ICD-10-CM | POA: Insufficient documentation

## 2022-09-06 DIAGNOSIS — M76899 Other specified enthesopathies of unspecified lower limb, excluding foot: Secondary | ICD-10-CM | POA: Insufficient documentation

## 2022-09-06 DIAGNOSIS — H6123 Impacted cerumen, bilateral: Secondary | ICD-10-CM | POA: Insufficient documentation

## 2022-09-06 DIAGNOSIS — F411 Generalized anxiety disorder: Secondary | ICD-10-CM | POA: Insufficient documentation

## 2022-09-06 DIAGNOSIS — N401 Enlarged prostate with lower urinary tract symptoms: Secondary | ICD-10-CM | POA: Insufficient documentation

## 2022-09-10 NOTE — Progress Notes (Deleted)
Celso Amy, PA-C [address]  [address]  [place], [place] [address]  Main: [phone]  Fax: [phone]   Gastroenterology Consultation  Referring Provider:     Eden Emms, NP Primary Care Physician:  Eden Emms, NP Primary Gastroenterologist:  *** Reason for Consultation:     H. Pylori Gastritis, chronic loose stools        HPI:   Adrian Moreno is a 52 y.o. y/o male referred for consultation & management  by Eden Emms, NP.    04/2022:  H. pylori stool test positive-treated with amoxicillin, clarithromycin, omeprazole twice daily for 14 days.  06/2022: H. pylori stool test again positive -referred to GI and infectious disease.  Labs 07/13/2022 showed elevated hemoglobin 17.4.  No anemia.  09/2021: Saw GI at the [facility] in [place] and had a EGD and colonoscopy 09/2021.  Duodenal biopsies negative for celiac.  Esophageal biopsy negative for Barrett's.  Gastric biopsy showed chronic gastritis and positive H. pylori.  Treated with amoxicillin/clarithromycin/PPI.    Colonoscopy 09/2021 at the [facility] showed tubular adenoma polyp removed from sigmoid colon.  Biopsies negative for microscopic colitis and IBD.  Patient has personal history of colon polyps and family history of cancer (father age 22s).  5-year repeat (due 09/2026).  Patient saw infectious disease specialist Dr. Rivka Safer 07/24/22 for persistent H. Pylori.  He was told to follow-up with GI to consider repeat EGD.  Next step in treatment would be bismuth plus tetracycline plus metronidazole (Pylera).  He admits to chronic loose stool and diarrhea over a year.  Recent C. difficile test negative.  Having 4-8 BMs daily.  Denies nausea or vomiting.  Admits to 10 to 15 pound weight loss.  Colonoscopy 05/2014 by Dr. Madilyn Fireman at [facility] showed good prep, mild internal hemorrhoids, otherwise normal.  10 year repeat.  Past Medical History:  Diagnosis Date   BPH (benign prostatic hypertrophy)    Wrenn   Cancer [facility])     Basal cell on face   Hypogonadism in male 04/22/2014   OSA (obstructive sleep apnea) 01/23/2012   did not tolerate CPAP   Superficial vein thrombosis 01/23/2015   after achilles surgery    Past Surgical History:  Procedure Laterality Date   COLONOSCOPY  05/2014   int hem, rpt 10 yrs Madilyn Fireman)   HIP SURGERY Left 2014   torn labrum   NASAL SEPTUM SURGERY  2014   s/p surgery x2 on same day with complications    Prior to Admission medications   Medication Sig Start Date End Date Taking? Authorizing Provider  albuterol (VENTOLIN HFA) 108 (90 Base) MCG/ACT inhaler Inhale 1-2 puffs into the lungs every 6 (six) hours as needed (cough). 10/07/20   Boddu, Belenda Cruise, FNP  benzonatate (TESSALON) 100 MG capsule Take 1 capsule (100 mg total) by mouth every 8 (eight) hours. 10/07/20   Boddu, Belenda Cruise, FNP  hydrOXYzine (ATARAX/VISTARIL) 25 MG tablet Take 0.5-1 tablets (12.5-25 mg total) by mouth 2 (two) times daily as needed for anxiety. 07/22/15   Eustaquio Boyden, MD  omeprazole (PRILOSEC) 20 MG capsule Take 1 capsule (20 mg total) by mouth 2 (two) times daily before a meal for 15 days. 05/21/22 06/05/22  Eden Emms, NP  silodosin (RAPAFLO) 8 MG CAPS capsule Take 8 mg by mouth daily with breakfast. 03/06/16   [provider]  SYRINGE-NEEDLE, DISP, 3 ML (B-D 3CC LUER-LOK SYR 18GX1-1/2) 18G X 1-1/2" 3 ML MISC     [provider]  tamsulosin (FLOMAX) 0.4 MG CAPS capsule Take 1 capsule (0.4 mg total) by mouth daily. 07/06/22   Eden Emms, NP  testosterone cypionate (DEPOTESTOSTERONE CYPIONATE) 200 MG/ML injection INJECT 0.5 MLS (100 MG TOTAL) INTO THE MUSCLE EVERY 14 (FOURTEEN) DAYS. (SINGLE USE VIALS) 08/21/22   Eden Emms, NP  Vitamin D, Ergocalciferol, (DRISDOL) 1.25 MG (50000 UNIT) CAPS capsule Take 1 capsule (50,000 Units total) by mouth every 7 (seven) days. 04/09/22   Eden Emms, NP    Family History  Problem Relation Age of Onset   Cancer Father 27       prostate  (normal PSAs) deceased at 52   Hypertension Father    Diabetes Father    Heart failure Father    CAD Neg Hx    Stroke Neg Hx      Social History   Tobacco Use   Smoking status: Never   Smokeless tobacco: Never  Vaping Use   Vaping status: Never Used  Substance Use Topics   Alcohol use: Yes    Alcohol/week: 0.0 standard drinks of alcohol    Comment: occasional   Drug use: No    Allergies as of 09/11/2022   (No Known Allergies)    Review of Systems:    All systems reviewed and negative except where noted in HPI.   Physical Exam:  There were no vitals taken for this visit. No LMP for male patient.  General:   Alert,  Well-developed, well-nourished, pleasant and cooperative in NAD Lungs:  Respirations even and unlabored.  Clear throughout to auscultation.   No wheezes, crackles, or rhonchi. No acute distress. Heart:  Regular rate and rhythm; no murmurs, clicks, rubs, or gallops. Abdomen:  Normal bowel sounds.  No bruits.  Soft, and non-distended without masses, hepatosplenomegaly or hernias noted.  No Tenderness.  No guarding or rebound tenderness.    Neurologic:  Alert and oriented x3;  grossly normal neurologically. Psych:  Alert and cooperative. Normal mood and affect.  Imaging Studies: No results found.  Assessment and Plan:   Adrian Moreno is a 52 y.o. y/o male has been referred for persistent H. pylori infection and loose stools.  H. pylori was treated twice with amoxicillin, clarithromycin, omeprazole for 14 days each.  Had EGD and colonoscopy 09/2021 through the [place] last year.  1.  Recurrent H. pylori gastritis infection  Start Pylera: Bismuth subsalicylate, tetracycline, metronidazole  Repeat H. pylori stool test 6 to 8 weeks after treatment ;  2.  Diarrhea with loose stools; recent C. difficile stool test negative  Stool studies: GI pathogen panel, C. difficile PCR, fecal pancreatic elastase, fecal calprotectin.  Last colonoscopy 09/2021 at the [facility] showed  biopsies negative for microscopic colitis and IBD.  EGD 09/2021 at the [facility] showed biopsies negative for celiac.  Trial of dicyclomine for IBS?  3.  History of colon polyps /family history of colon cancer (father age 45s)  5-year repeat colonoscopy will be due 09/2026.  Follow up 6 weeks.  Celso Amy, PA-C    BP check ***

## 2022-09-11 ENCOUNTER — Ambulatory Visit: Payer: Commercial Managed Care - PPO | Admitting: Physician Assistant

## 2022-10-02 DIAGNOSIS — N401 Enlarged prostate with lower urinary tract symptoms: Secondary | ICD-10-CM | POA: Insufficient documentation

## 2022-10-03 NOTE — Progress Notes (Signed)
+   Celso Amy, PA-C [address]  [address]  [place], [place] [address]  Main: [phone]  Fax: [phone]   Gastroenterology Consultation  Referring Provider:     Eden Emms, NP Primary Care Physician:  Eden Emms, NP Primary Gastroenterologist:  Celso Amy, PA-C  Reason for Consultation:     H. Pylori Infection; Diarrhea        HPI:   Adrian Moreno is a 52 y.o. y/o male referred for consultation & management  by Eden Emms, NP.    Patient saw infectious disease specialist Dr. Rivka Safer 07/24/2022 for persistent positive H. Pylori in spite of treatment.  He was also having diarrhea.  He was advised to see GI to further evaluate diarrhea and F/U H. Pylori.  He saw GI at the [place] in [place] in 2023.  Uunderwent EGD and Colonoscopy in 08/2021, The gastric biopsy showed chronic gastritis with helicobacter pylori seen in the tissue.  Treated with Amoxicillin/ clarithromycin and PPI for 2 weeks. He then saw a new PCP Mordecai Maes on March 2024 and he ordered Helicobacter pylori stool antigen which came back positive.  Patient was given another 2 weeks of amoxicillin / clarithromycin / Omeprazole on May 21, 2022.  Patient, completed treatment.  Patient has NOT taken Pylera or Bismuth quadruple therapy.  Current symptoms: 4-6 stools per day.  Denies current abdominal pain, rectal bleeding, nausea, vomiting, fever or chills.  He had 2 episodes of lower abdominal cramping in the past few months.  No current abdominal pain today.  His GI symptoms started after he had COVID in 2021.  Both the patient and wife had similar GI symptoms after having COVID.  Patient states he has very irregular bowel movements.  Has frequent small formed bowel movements daily.  He has occasional loose stool.  Episode of explosive diarrhea once per month.  Diarrhea has improved and become less frequent in the past few months.  He has not had any rectal bleeding.  Is currently gaining weight.  He  has history of acid reflux in the past.  He has been off PPI therapy for 2 months and is not currently having any heartburn or acid reflux.  C. difficile toxin and GDH was negative on 07/16/2022.   Repeat Helicobacter stool test was still positive 07/16/22.   Colonoscopy 08/2021 at the [facility] in [place] showed 1 small tubular adenoma removed from sigmoid colon.  Terminal ileum biopsies normal.  Colon biopsies showed no evidence of microscopic colitis.  Repeat colonoscopy in 7 years.  EGD 08/2021 showed benign duodenal and esophagus biopsies.  Stomach biopsy showed chronic active H. pylori gastritis.  No evidence of celiac or EOE.  Colonoscopy by Dr. Madilyn Fireman at [facility] 05/2014 showed small internal hemorrhoids, otherwise normal.  Good prep.  10-year repeat.  Past Medical History:  Diagnosis Date   BPH (benign prostatic hypertrophy)    Wrenn   Cancer [facility])    Basal cell on face   Hypogonadism in male 04/22/2014   OSA (obstructive sleep apnea) 01/23/2012   did not tolerate CPAP   Superficial vein thrombosis 01/23/2015   after achilles surgery    Past Surgical History:  Procedure Laterality Date   COLONOSCOPY  05/2014   int hem, rpt 10 yrs Madilyn Fireman)   HIP SURGERY Left 2014   torn labrum   NASAL SEPTUM SURGERY  2014   s/p surgery x2 on same day with complications    Prior to Admission medications  Medication Sig Start Date End Date Taking? Authorizing Provider  albuterol (VENTOLIN HFA) 108 (90 Base) MCG/ACT inhaler Inhale 1-2 puffs into the lungs every 6 (six) hours as needed (cough). 10/07/20   Boddu, Belenda Cruise, FNP  benzonatate (TESSALON) 100 MG capsule Take 1 capsule (100 mg total) by mouth every 8 (eight) hours. 10/07/20   Boddu, Belenda Cruise, FNP  hydrOXYzine (ATARAX/VISTARIL) 25 MG tablet Take 0.5-1 tablets (12.5-25 mg total) by mouth 2 (two) times daily as needed for anxiety. 07/22/15   Eustaquio Boyden, MD  silodosin (RAPAFLO) 8 MG CAPS capsule Take 8 mg by mouth daily with breakfast.  03/06/16   [provider]  SYRINGE-NEEDLE, DISP, 3 ML (B-D 3CC LUER-LOK SYR 18GX1-1/2) 18G X 1-1/2" 3 ML MISC     [provider]  tamsulosin (FLOMAX) 0.4 MG CAPS capsule Take 1 capsule (0.4 mg total) by mouth daily. 07/06/22   Eden Emms, NP  testosterone cypionate (DEPOTESTOSTERONE CYPIONATE) 200 MG/ML injection INJECT 0.5 MLS (100 MG TOTAL) INTO THE MUSCLE EVERY 14 (FOURTEEN) DAYS. (SINGLE USE VIALS) 08/21/22   Eden Emms, NP  Vitamin D, Ergocalciferol, (DRISDOL) 1.25 MG (50000 UNIT) CAPS capsule Take 1 capsule (50,000 Units total) by mouth every 7 (seven) days. 04/09/22   Eden Emms, NP    Family History  Problem Relation Age of Onset   Cancer Father 78       prostate (normal PSAs) deceased at 2   Hypertension Father    Diabetes Father    Heart failure Father    CAD Neg Hx    Stroke Neg Hx      Social History   Tobacco Use   Smoking status: Never   Smokeless tobacco: Never  Vaping Use   Vaping status: Never Used  Substance Use Topics   Alcohol use: Yes    Alcohol/week: 0.0 standard drinks of alcohol    Comment: occasional   Drug use: No    Allergies as of 10/04/2022   (No Known Allergies)    Review of Systems:    All systems reviewed and negative except where noted in HPI.   Physical Exam:  BP 123/83   Pulse 64   Temp 97.7 F (36.5 C)   Ht 5\' 9"  (1.753 m)   Wt 201 lb 3.2 oz (91.3 kg)   BMI 29.71 kg/m  No LMP for male patient.  General:   Alert,  Well-developed, well-nourished, pleasant and cooperative in NAD Lungs:  Respirations even and unlabored.  Clear throughout to auscultation.   No wheezes, crackles, or rhonchi. No acute distress. Heart:  Regular rate and rhythm; no murmurs, clicks, rubs, or gallops. Abdomen:  Normal bowel sounds.  No bruits.  Soft, and non-distended without masses, hepatosplenomegaly or hernias noted.  No Tenderness.  No guarding or rebound tenderness.    Neurologic:  Alert and oriented x3;  grossly normal  neurologically. Psych:  Alert and cooperative. Normal mood and affect.  Imaging Studies: No results found.  Assessment and Plan:   [name] is a 52 y.o. y/o male has been referred for:  1.  History of recurrent H. pylori infection (failed Tx with Amox / Clarith / PPI x 2)  Performed H. Pylori Breath Test today.  If Positive, then Treat with Pylera x 14 days: Bismuth subsalicylate 300 mg 4 times daily,  Tetracycline 500 mg 4 times daily,  Metronidazole 250 mg 4 times daily,  Omeprazole 20 Mg twice daily  Order follow-up breath test 4 to 6 weeks after  finishing treatment.  2.  Irregular bowel habits and intermittent lower abdominal cramping.  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  I am ordering celiac labs to rule out celiac disease.  Last colonoscopy at the [place] in 2023 showed no evidence of inflammatory bowel disease and biopsies were negative for microscopic colitis.  EGD done at the [place] in 2023 showed biopsies negative for celiac and EOE.  Biopsies were positive for H. pylori which was treated.  -Celiac labs  -Continue fiber supplement daily  3.  History of adenomatous colon polyp; 1 small tubular adenoma removed from sigmoid colon 08/2021.  -7-year repeat colonoscopy will be due 08/2028  -If GI symptoms worsen, consider sooner colonoscopy.    Follow up in 6 to 8 weeks with TG.    Celso Amy, PA-C

## 2022-10-04 ENCOUNTER — Ambulatory Visit (INDEPENDENT_AMBULATORY_CARE_PROVIDER_SITE_OTHER): Payer: Commercial Managed Care - PPO | Admitting: Physician Assistant

## 2022-10-04 ENCOUNTER — Encounter: Payer: Self-pay | Admitting: Physician Assistant

## 2022-10-04 VITALS — BP 123/83 | HR 64 | Temp 97.7°F | Ht 69.0 in | Wt 201.2 lb

## 2022-10-04 DIAGNOSIS — R195 Other fecal abnormalities: Secondary | ICD-10-CM

## 2022-10-04 DIAGNOSIS — Z8619 Personal history of other infectious and parasitic diseases: Secondary | ICD-10-CM

## 2022-10-04 DIAGNOSIS — R1030 Lower abdominal pain, unspecified: Secondary | ICD-10-CM | POA: Diagnosis not present

## 2022-10-04 DIAGNOSIS — Z86010 Personal history of colonic polyps: Secondary | ICD-10-CM | POA: Diagnosis not present

## 2022-10-04 DIAGNOSIS — K580 Irritable bowel syndrome with diarrhea: Secondary | ICD-10-CM

## 2022-10-04 DIAGNOSIS — R194 Change in bowel habit: Secondary | ICD-10-CM | POA: Diagnosis not present

## 2022-10-05 LAB — H. PYLORI BREATH TEST: H pylori Breath Test: POSITIVE — AB

## 2022-10-08 ENCOUNTER — Telehealth: Payer: Self-pay

## 2022-10-08 ENCOUNTER — Other Ambulatory Visit: Payer: Self-pay | Admitting: Physician Assistant

## 2022-10-08 DIAGNOSIS — A048 Other specified bacterial intestinal infections: Secondary | ICD-10-CM

## 2022-10-08 MED ORDER — BISMUTH SUBSALICYLATE 525 MG PO TABS
1.0000 | ORAL_TABLET | Freq: Four times a day (QID) | ORAL | 0 refills | Status: AC
Start: 2022-10-08 — End: 2022-10-22

## 2022-10-08 MED ORDER — TETRACYCLINE HCL 500 MG PO CAPS
500.0000 mg | ORAL_CAPSULE | Freq: Four times a day (QID) | ORAL | 0 refills | Status: AC
Start: 2022-10-08 — End: 2022-10-22

## 2022-10-08 MED ORDER — OMEPRAZOLE 20 MG PO CPDR
20.0000 mg | DELAYED_RELEASE_CAPSULE | Freq: Two times a day (BID) | ORAL | 0 refills | Status: DC
Start: 2022-10-08 — End: 2022-12-26

## 2022-10-08 MED ORDER — METRONIDAZOLE 250 MG PO TABS
250.0000 mg | ORAL_TABLET | Freq: Four times a day (QID) | ORAL | 0 refills | Status: AC
Start: 2022-10-08 — End: 2022-10-22

## 2022-10-08 NOTE — Progress Notes (Signed)
Notify patient H. pylori breath test is positive.  Need to treat with the following medications for 14 days: -Bismuth subsalicylate 300 mg 4 times daily,  -Tetracycline 500 mg 4 times daily,  -Metronidazole 250 mg 4 times daily,  -Omeprazole 20 Mg twice daily. I have sent medications to [facility] in [place]. We will plan to repeat H. pylori breath test 4 to 6 weeks after he finishes treatment. Celso Amy, PA-C

## 2022-10-08 NOTE — Telephone Encounter (Signed)
  Patient notified and instructed to pick up medications today and retest in 4-6-weeks.  Notify patient H. pylori breath test is positive.  Need to treat with the following medications for 14 days:  -Bismuth subsalicylate 300 mg 4 times daily,  -Tetracycline 500 mg 4 times daily,  -Metronidazole 250 mg 4 times daily,  -Omeprazole 20 Mg twice daily.  I have sent [facility] to [facility] in [place].  We will plan to repeat H. pylori breath test 4 to 6 weeks after he finishes treatment.  Celso Amy, PA-C

## 2022-10-18 ENCOUNTER — Other Ambulatory Visit: Payer: Commercial Managed Care - PPO

## 2022-10-18 DIAGNOSIS — D751 Secondary polycythemia: Secondary | ICD-10-CM

## 2022-10-18 LAB — CBC
HCT: 53.4 % — ABNORMAL HIGH (ref 39.0–52.0)
Hemoglobin: 17.7 g/dL — ABNORMAL HIGH (ref 13.0–17.0)
MCHC: 33.1 g/dL (ref 30.0–36.0)
MCV: 94.7 fl (ref 78.0–100.0)
Platelets: 197 10*3/uL (ref 150.0–400.0)
RBC: 5.64 Mil/uL (ref 4.22–5.81)
RDW: 13.3 % (ref 11.5–15.5)
WBC: 6.9 10*3/uL (ref 4.0–10.5)

## 2022-10-23 ENCOUNTER — Telehealth: Payer: Self-pay | Admitting: Nurse Practitioner

## 2022-10-23 ENCOUNTER — Other Ambulatory Visit: Payer: Self-pay

## 2022-10-23 ENCOUNTER — Emergency Department: Payer: Commercial Managed Care - PPO

## 2022-10-23 ENCOUNTER — Emergency Department
Admission: EM | Admit: 2022-10-23 | Discharge: 2022-10-23 | Disposition: A | Discharge status: Home / Self Care | Payer: Commercial Managed Care - PPO | Attending: Emergency Medicine | Admitting: Emergency Medicine

## 2022-10-23 DIAGNOSIS — R195 Other fecal abnormalities: Secondary | ICD-10-CM | POA: Diagnosis present

## 2022-10-23 DIAGNOSIS — R109 Unspecified abdominal pain: Secondary | ICD-10-CM | POA: Insufficient documentation

## 2022-10-23 DIAGNOSIS — K921 Melena: Secondary | ICD-10-CM

## 2022-10-23 LAB — BASIC METABOLIC PANEL
Anion gap: 11 (ref 5–15)
BUN: 18 mg/dL (ref 6–20)
CO2: 28 mmol/L (ref 22–32)
Calcium: 9.4 mg/dL (ref 8.9–10.3)
Chloride: 99 mmol/L (ref 98–111)
Creatinine, Ser: 0.87 mg/dL (ref 0.61–1.24)
GFR, Estimated: >60 mL/min (ref >60)
Glucose, Bld: 90 mg/dL (ref 70–99)
Potassium: 4.2 mmol/L (ref 3.5–5.1)
Sodium: 138 mmol/L (ref 135–145)

## 2022-10-23 LAB — CBC
HCT: 51 % (ref 39.0–52.0)
Hemoglobin: 17.4 g/dL — ABNORMAL HIGH (ref 13.0–17.0)
MCH: 31 pg (ref 26.0–34.0)
MCHC: 34.1 g/dL (ref 30.0–36.0)
MCV: 90.9 fL (ref 80.0–100.0)
Platelets: 193 10*3/uL (ref 150–400)
RBC: 5.61 MIL/uL (ref 4.22–5.81)
RDW: 12.7 % (ref 11.5–15.5)
WBC: 8.1 10*3/uL (ref 4.0–10.5)
nRBC: 0 % (ref 0.0–0.2)

## 2022-10-23 LAB — HEPATIC FUNCTION PANEL
ALT: 25 U/L (ref 0–44)
AST: 22 U/L (ref 15–41)
Albumin: 4.5 g/dL (ref 3.5–5.0)
Alkaline Phosphatase: 59 U/L (ref 38–126)
Bilirubin, Direct: 0.2 mg/dL (ref 0.0–0.2)
Indirect Bilirubin: 1.1 mg/dL — ABNORMAL HIGH (ref 0.3–0.9)
Total Bilirubin: 1.3 mg/dL — ABNORMAL HIGH (ref 0.3–1.2)
Total Protein: 7.6 g/dL (ref 6.5–8.1)

## 2022-10-23 LAB — PROTIME-INR
INR: 1.1 (ref 0.8–1.2)
Prothrombin Time: 14.7 s (ref 11.4–15.2)

## 2022-10-23 MED ORDER — IOHEXOL 300 MG/ML  SOLN
100.0000 mL | Freq: Once | INTRAMUSCULAR | Status: AC | PRN
  Administered 2022-10-23: 100 mL via INTRAVENOUS

## 2022-10-23 MED ORDER — BISMUTH SUBSALICYLATE 262 MG PO CHEW: 524.0000 mg | CHEWABLE_TABLET | Freq: Three times a day (TID) | ORAL | 0 refills | Status: AC

## 2022-10-23 MED ORDER — DICYCLOMINE HCL 20 MG PO TABS: 20.0000 mg | ORAL_TABLET | Freq: Three times a day (TID) | ORAL | 0 refills | Status: DC

## 2022-10-23 NOTE — ED Notes (Signed)
Pt transported to CT ?

## 2022-10-23 NOTE — ED Provider Notes (Signed)
[facility] Provider Note    Event Date/Time   First MD Initiated Contact with Patient 10/23/22 1835     (approximate)   History   Blood In Stools   HPI  Adrian Moreno is a 52 y.o. male here with blood in stools.  The patient states that for the last day, he has had persistent, mildly worsening bloody stools.  He has a history of recurrent H. pylori gastritis and is currently scheduled to start an antibiotic treatment for this tomorrow.  Denies history of lower GI bleed.  Throughout the day today, he initially had some small bleeding but then had a larger bloody bowel movement prior to arrival.  Has not had 1 since.  No fevers or chills.  No lightheadedness.  No blood thinner use.     Physical Exam   Triage Vital Signs: ED Triage Vitals  Encounter Vitals Group     BP 10/23/22 1613 (!) 146/94     Systolic BP Percentile --      Diastolic BP Percentile --      Pulse Rate 10/23/22 1613 63     Resp 10/23/22 1613 16     Temp 10/23/22 1614 98 F (36.7 C)     Temp src --      SpO2 10/23/22 1613 97 %     Weight --      Height --      Head Circumference --      Peak Flow --      Pain Score 10/23/22 1613 0     Pain Loc --      Pain Education --      Exclude from Growth Chart --     Most recent vital signs: Vitals:   10/23/22 1937 10/23/22 2152  BP: (!) 174/95 (!) 161/110  Pulse: 67 71  Resp: 16 16  Temp:  98.2 F (36.8 C)  SpO2: 100% 97%     General: Awake, no distress.  CV:  Good peripheral perfusion.  Resp:  Normal work of breathing.  Lungs clear to auscultation bilaterally. Abd:  No distention.  Mild left lower quadrant tenderness.  No rebound guarding. Other:  No active rectal bleeding.   ED Results / Procedures / Treatments   Labs (all labs ordered are listed, but only abnormal results are displayed) Labs Reviewed  CBC - Abnormal; Notable for the following components:      Result Value   Hemoglobin 17.4 (*)    All other  components within normal limits  HEPATIC FUNCTION PANEL - Abnormal; Notable for the following components:   Total Bilirubin 1.3 (*)    Indirect Bilirubin 1.1 (*)    All other components within normal limits  BASIC METABOLIC PANEL  PROTIME-INR     EKG    RADIOLOGY CT abdomen/pelvis: No acute abnormality   I also independently reviewed and agree with radiologist interpretations.   PROCEDURES:  Critical Care performed: No   MEDICATIONS ORDERED IN ED: Medications  iohexol (OMNIPAQUE) 300 MG/ML solution 100 mL (100 mLs Intravenous Contrast Given 10/23/22 1958)     IMPRESSION / MDM / ASSESSMENT AND PLAN / ED COURSE  I reviewed the triage vital signs and the nursing notes.                              Differential diagnosis includes, but is not limited to, infectious colitis, diverticulitis, internal hemorrhoids, proctitis, Crohn's/IBD  Patient's presentation  is most consistent with acute presentation with potential threat to life or bodily function.  The patient is on the cardiac monitor to evaluate for evidence of arrhythmia and/or significant heart rate changes   52 yo well appearing male here with BRBPR. H/o similar episode once in past. He has an extensive GI history including recurrent H. Pylori for which he is about to start tx. Suspect possible internal hemorrhoids, bleeding related to mucosal irritation from increased BM frequency from his H. Pylori/gastritis. Do not suspect brisk upper GI bleed. He is HDS and hgb is normal. CT a/p obtained, reviewed, and is normal. BMP unremarkable. LFts normal, mild bli elevation likely 2/2 dehydration.  I had a long discussion with pt. He is about to start quad therpay for H Pylori which would likely cover any mild colitis. Will trial bentyl as I suspect he has a component of IBS as well. No signs of active severe LGIB, no major diverticulosis on exam, and he is not on blood thinner with no other high risk features. No signs to  suggest ischemic bowel disease. Will have him f/u with GI for colo/further work-up.   FINAL CLINICAL IMPRESSION(S) / ED DIAGNOSES   Final diagnoses:  Blood in stool     Rx / DC Orders   ED Discharge Orders          Ordered    dicyclomine (BENTYL) 20 MG tablet  3 times daily before meals & bedtime        10/23/22 2151    bismuth subsalicylate (PEPTO BISMOL) 262 MG chewable tablet  3 times daily before meals & bedtime        10/23/22 2151             Note:  This document was prepared using Dragon voice recognition software and may include unintentional dictation errors.   Shaune Pollack, MD 10/23/22 2241

## 2022-10-23 NOTE — Discharge Instructions (Signed)
START taking the medications prescribed by your GI doctor  START the Bentyl as prescribed  I'd recommend considering COLACE and over-the-counter fiber and probiotics to help with symptoms as well

## 2022-10-23 NOTE — ED Notes (Signed)
Pt d/c home per MD order. Discharge summary reviewed, pt verbalizes understanding. Ambulatory off unit. No s/s of acute distress noted at discharge.  °

## 2022-10-23 NOTE — ED Triage Notes (Signed)
Pt comes with c/o blood in stool the patient states this all started today. Pt states no pain. Pt states he is being treated for H- pylori and hasn't started meds yet he was suppose to today.

## 2022-10-23 NOTE — Telephone Encounter (Signed)
FYI: This call has been transferred to Access Nurse. Once the result note has been entered staff can address the message at that time.  Patient called in with the following symptoms:  Red Word:blood in stool   Please advise at Mobile [phone] (mobile)  Message is routed to Provider Pool and [facility] Triage    Pt called stating he scheduled an appt with Joellen to see Dr. Alphonsus Sias for tomorrow, 10/2 for blood in stool. Pt states it was a lot of blood in his recent bowel movement. Transferred to access nurse.

## 2022-10-23 NOTE — Telephone Encounter (Signed)
Per chart review tab pt is presently at [facility] ED. Sending note to Audria Nine NP as Lorain Childes to PCP who is out of office and Dr Milinda Antis who is in office.

## 2022-10-24 ENCOUNTER — Ambulatory Visit: Payer: Commercial Managed Care - PPO | Admitting: Internal Medicine

## 2022-10-24 NOTE — Telephone Encounter (Signed)
Noted. I do see that the patient was seen in the emergency department

## 2022-10-25 ENCOUNTER — Encounter: Payer: Self-pay | Admitting: Nurse Practitioner

## 2022-10-26 ENCOUNTER — Encounter: Payer: Self-pay | Admitting: Nurse Practitioner

## 2022-10-26 DIAGNOSIS — K921 Melena: Secondary | ICD-10-CM

## 2022-11-14 NOTE — Progress Notes (Signed)
Celso Amy, PA-C [address]  [address]  [place], [place] [address]  Main: [phone]  Fax: [phone]   Primary Care Physician: Eden Emms, NP  Primary Gastroenterologist:  Celso Amy, PA-C / Dr. Wyline Mood    CC: F/U Helicobacter pylori infection  HPI: Adrian Moreno is a 52 y.o. male returns for 1 month follow-up of persistent H. pylori infection.  10/04/2022:  H. pylori breath test positive.  Treated with quadruple therapy: Bismuth subsalicylate, tetracycline, metronidazole, omeprazole x 14 days.  He finished all treatment 1 week ago.   GI history:  he saw GI at the [facility] in [place] in 2023.  Uunderwent EGD and Colonoscopy in 08/2021, The gastric biopsy showed chronic gastritis with helicobacter pylori seen in the tissue.  Treated with Amoxicillin/ clarithromycin and PPI for 2 weeks. He then saw a new PCP Mordecai Maes on March 2024 and he ordered Helicobacter pylori stool antigen which came back positive.  Patient was given another 2 weeks of amoxicillin / clarithromycin / Omeprazole on May 21, 2022.  Patient, completed treatment.  Patient saw infectious disease specialist Dr. Rivka Safer 07/24/2022 for persistent positive H. Pylori in spite of treatment.  He was also having diarrhea.  He was told to follow-up with GI.   Current symptoms: He went to [facility] ED 10/23/2022 to evaluate acute rectal bleeding.  He noticed a lot of bright red blood in the toilet 1 time after bowel movements.  He has had mild intermittent left lower quadrant abdominal cramping.  Still having frequent urgent bowel movements 5-7 bowel movements daily.  Stools are solid and formed in the mornings and loose in the afternoons.  He denies any current upper GI symptoms.  Not currently taking any GI medication.  Abdominal pelvic CT 10/23/2022 showed no acute abnormality.  CBC showed normal hemoglobin 17g, no anemia.   C. difficile toxin and GDH was negative on 07/16/2022.   Repeat Helicobacter stool test  was still positive 07/16/22.    Colonoscopy 08/2021 at the [facility] in [place] showed 1 small tubular adenoma removed from sigmoid colon.  Terminal ileum biopsies normal.  Colon biopsies showed no evidence of microscopic colitis.  Repeat colonoscopy in 7 years.   EGD 08/2021 showed benign duodenal and esophagus biopsies.  Stomach biopsy showed chronic active H. pylori gastritis.  No evidence of celiac or EOE.   Colonoscopy by Dr. Madilyn Fireman at [facility] 05/2014 showed small internal hemorrhoids, otherwise normal.  Good prep.  10-year repeat. Current Outpatient Medications  Medication Sig Dispense Refill   dicyclomine (BENTYL) 10 MG capsule Take 1 capsule (10 mg total) by mouth 3 (three) times daily before meals. 90 capsule 2   hydrOXYzine (ATARAX/VISTARIL) 25 MG tablet Take 0.5-1 tablets (12.5-25 mg total) by mouth 2 (two) times daily as needed for anxiety. 40 tablet 0   polyethylene glycol-electrolytes (NULYTELY) 420 g solution Take 4,000 mLs by mouth once for 1 dose. Use as directed for your colonoscopy preparation 4000 mL 0   SYRINGE-NEEDLE, DISP, 3 ML (B-D 3CC LUER-LOK SYR 18GX1-1/2) 18G X 1-1/2" 3 ML MISC      tamsulosin (FLOMAX) 0.4 MG CAPS capsule Take 1 capsule (0.4 mg total) by mouth daily. 90 capsule 1   testosterone cypionate (DEPOTESTOSTERONE CYPIONATE) 200 MG/ML injection INJECT 0.5 MLS (100 MG TOTAL) INTO THE MUSCLE EVERY 14 (FOURTEEN) DAYS. (SINGLE USE VIALS) 1 mL 2   Vitamin D, Ergocalciferol, (DRISDOL) 1.25 MG (50000 UNIT) CAPS capsule Take 1 capsule (50,000 Units total) by mouth  every 7 (seven) days. 12 capsule 0   omeprazole (PRILOSEC) 20 MG capsule Take 1 capsule (20 mg total) by mouth 2 (two) times daily for 14 days. 28 capsule 0   No current facility-administered medications for this visit.    Allergies as of 11/15/2022   (No Known Allergies)    Past Medical History:  Diagnosis Date   BPH (benign prostatic hypertrophy)    [place]   Cancer [facility])    Basal cell on face    Hypogonadism in male 04/22/2014   OSA (obstructive sleep apnea) 01/23/2012   did not tolerate CPAP   Superficial vein thrombosis 01/23/2015   after achilles surgery    Past Surgical History:  Procedure Laterality Date   COLONOSCOPY  05/2014   int hem, rpt 10 yrs Madilyn Fireman)   HIP SURGERY Left 2014   torn labrum   NASAL SEPTUM SURGERY  2014   s/p surgery x2 on same day with complications    Review of Systems:    All systems reviewed and negative except where noted in HPI.   Physical Examination:   BP 103/68   Pulse 71   Temp 97.7 F (36.5 C)   Ht 5\' 9"  (1.753 m)   Wt 197 lb (89.4 kg)   BMI 29.09 kg/m   General: Well-nourished, well-developed in no acute distress.  Neuro: Alert and oriented x 3.  Grossly intact.  Psych: Alert and cooperative, normal mood and affect.   Imaging Studies: CT ABDOMEN PELVIS W CONTRAST  Result Date: 10/23/2022 CLINICAL DATA:  Abdomen pain blood in stool EXAM: CT ABDOMEN AND PELVIS WITH CONTRAST TECHNIQUE: Multidetector CT imaging of the abdomen and pelvis was performed using the standard protocol following bolus administration of intravenous contrast. RADIATION DOSE REDUCTION: This exam was performed according to the departmental dose-optimization program which includes automated exposure control, adjustment of the mA and/or kV according to patient size and/or use of iterative reconstruction technique. CONTRAST:  OMNIPAQUE IOHEXOL 300 MG/ML  SOLN COMPARISON:  None Available. FINDINGS: Lower chest: No acute abnormality. Hepatobiliary: No focal liver abnormality is seen. No gallstones, gallbladder wall thickening, or biliary dilatation. Pancreas: Unremarkable. No pancreatic ductal dilatation or surrounding inflammatory changes. Spleen: Normal in size without focal abnormality. Adrenals/Urinary Tract: Adrenal glands are unremarkable. Kidneys are without hydronephrosis. Cysts in the left kidney, no imaging follow-up is recommended. The bladder is normal  Stomach/Bowel: Stomach is within normal limits. Appendix appears normal. No evidence of bowel wall thickening, distention, or inflammatory changes. Vascular/Lymphatic: Mild aortic atherosclerosis. No aneurysm. No suspicious lymph node Reproductive: Prostate is unremarkable. Other: Negative for pelvic effusion or free air Musculoskeletal: No acute or significant osseous findings. IMPRESSION: 1. No CT evidence for acute intra-abdominal or pelvic abnormality. 2. Aortic atherosclerosis. Aortic Atherosclerosis (ICD10-I70.0). Electronically Signed   By: Jasmine Pang M.D.   On: 10/23/2022 21:41    Assessment and Plan:   Burrel Nardozzi is a 52 y.o. y/o male returns for 1 month follow-up of H. pylori infection.  Most recently treated 10/04/2022 with quadruple therapy: Bismuth subsalicylate, tetracycline, metronidazole, and omeprazole x 14 days.  He finished antibiotics 1 week ago.  1.  Recurrent H. pylori infection  Repeat H. pylori breath test in 5 weeks.  2.  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  Colonoscopy in 2023 showed biopsies negative for microscopic colitis.  OTC align probiotic. Start Rx dicyclomine 10 Mg 3 times daily PRN abdominal cramping.  Start FiberCon tablets 2 tablets once or twice daily with 64  ounces of fluids daily.  3.  Acute rectal bleeding; currently resolved; no anemia; suspect hemorrhoid bleeding; cannot rule out IBD given urgent bowel movements.  Scheduling Colonoscopy I discussed risks of colonoscopy with patient to include risk of bleeding, colon perforation, and risk of sedation.  Patient expressed understanding and agrees to proceed with colonoscopy.   4.  History of adenomatous colon polyp; 1 small tubular adenoma removed from sigmoid colon 08/2021.              Celso Amy, PA-C  Follow up in 3 months with TG.

## 2022-11-15 ENCOUNTER — Ambulatory Visit: Payer: Commercial Managed Care - PPO | Admitting: Physician Assistant

## 2022-11-15 ENCOUNTER — Encounter: Payer: Self-pay | Admitting: Physician Assistant

## 2022-11-15 VITALS — BP 103/68 | HR 71 | Temp 97.7°F | Ht 69.0 in | Wt 197.0 lb

## 2022-11-15 DIAGNOSIS — Z860101 Personal history of adenomatous and serrated colon polyps: Secondary | ICD-10-CM

## 2022-11-15 DIAGNOSIS — K580 Irritable bowel syndrome with diarrhea: Secondary | ICD-10-CM

## 2022-11-15 DIAGNOSIS — K625 Hemorrhage of anus and rectum: Secondary | ICD-10-CM

## 2022-11-15 DIAGNOSIS — K589 Irritable bowel syndrome without diarrhea: Secondary | ICD-10-CM | POA: Diagnosis not present

## 2022-11-15 DIAGNOSIS — B9681 Helicobacter pylori [H. pylori] as the cause of diseases classified elsewhere: Secondary | ICD-10-CM | POA: Diagnosis not present

## 2022-11-15 DIAGNOSIS — A048 Other specified bacterial intestinal infections: Secondary | ICD-10-CM

## 2022-11-15 MED ORDER — DICYCLOMINE HCL 10 MG PO CAPS
10.0000 mg | ORAL_CAPSULE | Freq: Three times a day (TID) | ORAL | 2 refills | Status: DC
Start: 2022-11-15 — End: 2022-12-26

## 2022-11-15 MED ORDER — PEG 3350-KCL-NA BICARB-NACL 420 G PO SOLR: 4000.0000 mL | Freq: Once | ORAL | 0 refills | Status: AC

## 2022-11-15 NOTE — Patient Instructions (Addendum)
Treatment for irritable bowel syndrome: 1.  Prescription dicyclomine 10 Mg 1 tablet 3 times daily as needed for abdominal cramping. 2.  Start FiberCon tablets, take 2 tablets once or twice daily with 8 ounces of water. 3.  Drink 64 ounces of fluids daily. 4.  Recommend try OTC Align probiotic 1 capsule once daily for 30 days.  Return for H. pylori breath test in 5 weeks.

## 2022-11-21 ENCOUNTER — Encounter: Payer: Self-pay | Admitting: Physician Assistant

## 2022-11-22 ENCOUNTER — Encounter (HOSPITAL_COMMUNITY): Payer: Self-pay

## 2022-11-22 ENCOUNTER — Other Ambulatory Visit: Payer: Self-pay

## 2022-11-22 ENCOUNTER — Emergency Department (HOSPITAL_COMMUNITY)
Admission: EM | Admit: 2022-11-22 | Discharge: 2022-11-23 | Disposition: A | Discharge status: Home / Self Care | Payer: Commercial Managed Care - PPO | Attending: Emergency Medicine | Admitting: Emergency Medicine

## 2022-11-22 ENCOUNTER — Emergency Department (HOSPITAL_COMMUNITY): Payer: Commercial Managed Care - PPO

## 2022-11-22 DIAGNOSIS — R1033 Periumbilical pain: Secondary | ICD-10-CM | POA: Diagnosis present

## 2022-11-22 DIAGNOSIS — K654 Sclerosing mesenteritis: Secondary | ICD-10-CM | POA: Insufficient documentation

## 2022-11-22 DIAGNOSIS — K5900 Constipation, unspecified: Secondary | ICD-10-CM | POA: Insufficient documentation

## 2022-11-22 LAB — CBC WITH DIFFERENTIAL/PLATELET
Abs Immature Granulocytes: 0.04 10*3/uL (ref 0.00–0.07)
Basophils Absolute: 0.1 10*3/uL (ref 0.0–0.1)
Basophils Relative: 1 %
Eosinophils Absolute: 0.3 10*3/uL (ref 0.0–0.5)
Eosinophils Relative: 3 %
HCT: 49.6 % (ref 39.0–52.0)
Hemoglobin: 16.8 g/dL (ref 13.0–17.0)
Immature Granulocytes: 1 %
Lymphocytes Relative: 26 %
Lymphs Abs: 2 10*3/uL (ref 0.7–4.0)
MCH: 32.1 pg (ref 26.0–34.0)
MCHC: 33.9 g/dL (ref 30.0–36.0)
MCV: 94.7 fL (ref 80.0–100.0)
Monocytes Absolute: 0.7 10*3/uL (ref 0.1–1.0)
Monocytes Relative: 9 %
Neutro Abs: 4.5 10*3/uL (ref 1.7–7.7)
Neutrophils Relative %: 60 %
Platelets: 182 10*3/uL (ref 150–400)
RBC: 5.24 MIL/uL (ref 4.22–5.81)
RDW: 12.7 % (ref 11.5–15.5)
WBC: 7.6 10*3/uL (ref 4.0–10.5)
nRBC: 0 % (ref 0.0–0.2)

## 2022-11-22 LAB — LIPASE, BLOOD: Lipase: 30 U/L (ref 11–51)

## 2022-11-22 LAB — COMPREHENSIVE METABOLIC PANEL
ALT: 53 U/L — ABNORMAL HIGH (ref 0–44)
AST: 29 U/L (ref 15–41)
Albumin: 4 g/dL (ref 3.5–5.0)
Alkaline Phosphatase: 53 U/L (ref 38–126)
Anion gap: 11 (ref 5–15)
BUN: 17 mg/dL (ref 6–20)
CO2: 25 mmol/L (ref 22–32)
Calcium: 9.7 mg/dL (ref 8.9–10.3)
Chloride: 102 mmol/L (ref 98–111)
Creatinine, Ser: 0.91 mg/dL (ref 0.61–1.24)
GFR, Estimated: >60 mL/min (ref >60)
Glucose, Bld: 108 mg/dL — ABNORMAL HIGH (ref 70–99)
Potassium: 3.8 mmol/L (ref 3.5–5.1)
Sodium: 138 mmol/L (ref 135–145)
Total Bilirubin: 1 mg/dL (ref 0.3–1.2)
Total Protein: 6.7 g/dL (ref 6.5–8.1)

## 2022-11-22 MED ORDER — IOHEXOL 350 MG/ML SOLN
75.0000 mL | Freq: Once | INTRAVENOUS | Status: AC | PRN
  Administered 2022-11-22: 75 mL via INTRAVENOUS

## 2022-11-22 NOTE — Telephone Encounter (Signed)
Triage please.

## 2022-11-22 NOTE — Telephone Encounter (Signed)
noted 

## 2022-11-22 NOTE — Telephone Encounter (Signed)
Patient returned call, transferred to access nurse for symptoms. Sending to triage and provider pool

## 2022-11-22 NOTE — Telephone Encounter (Addendum)
Unable to reach pt by phone and left v/m requesting pt to cb [phone] for additional info and triage. Also my charted pt to call office.sending note to Audria Nine NP, Kim pool and lsc triage.

## 2022-11-22 NOTE — ED Provider Notes (Signed)
[place] EMERGENCY DEPARTMENT AT [facility] Provider Note   CSN: [id number] Arrival date & time: 11/22/22  1642     History  Chief Complaint  Patient presents with   Abdominal Pain    Adrian Moreno is a 52 y.o. male.  52 year old male with a history of H. pylori who presents to the emergency department with abdominal pain and constipation.  Patient reports that he has a longstanding history of chronic diarrhea and H. pylori.  Completed a round of treatment for his H. pylori recently and was initially having diarrhea but 2 days ago started experiencing constipation.  Says that he has had change in his stool caliber and is now very thin.  Says that he feels periumbilical abdominal pain whenever he coughs, laughs, or tries to have a bowel movement.  Says that he feels like he is filled with gas but has had decreased flatus and is worried about a bowel obstruction.  No abdominal surgeries.  No nausea or vomiting.       Home Medications Prior to Admission medications   Medication Sig Start Date End Date Taking? Authorizing Provider  dicyclomine (BENTYL) 10 MG capsule Take 1 capsule (10 mg total) by mouth 3 (three) times daily before meals. 11/15/22 02/13/23  Adrian Amy, PA-C  hydrOXYzine (ATARAX/VISTARIL) 25 MG tablet Take 0.5-1 tablets (12.5-25 mg total) by mouth 2 (two) times daily as needed for anxiety. 07/22/15   Adrian Boyden, MD  omeprazole (PRILOSEC) 20 MG capsule Take 1 capsule (20 mg total) by mouth 2 (two) times daily for 14 days. 10/08/22 10/22/22  Adrian Amy, PA-C  SYRINGE-NEEDLE, DISP, 3 ML (B-D 3CC LUER-LOK SYR 18GX1-1/2) 18G X 1-1/2" 3 ML MISC     [provider]  tamsulosin (FLOMAX) 0.4 MG CAPS capsule Take 1 capsule (0.4 mg total) by mouth daily. 07/06/22   Adrian Emms, NP  testosterone cypionate (DEPOTESTOSTERONE CYPIONATE) 200 MG/ML injection INJECT 0.5 MLS (100 MG TOTAL) INTO THE MUSCLE EVERY 14 (FOURTEEN) DAYS. (SINGLE USE VIALS) 08/21/22    Adrian Emms, NP  Vitamin D, Ergocalciferol, (DRISDOL) 1.25 MG (50000 UNIT) CAPS capsule Take 1 capsule (50,000 Units total) by mouth every 7 (seven) days. 04/09/22   Adrian Emms, NP      Allergies    Patient has no known allergies.    Review of Systems   Review of Systems  Physical Exam Updated Vital Signs BP 124/85   Pulse 61   Temp 98.2 F (36.8 C)   Resp 18   Ht 5\' 9"  (1.753 m)   Wt 89.4 kg   SpO2 100%   BMI 29.11 kg/m  Physical Exam Vitals and nursing note reviewed.  Constitutional:      General: He is not in acute distress.    Appearance: He is well-developed.  HENT:     Head: Normocephalic and atraumatic.     Right Ear: External ear normal.     Left Ear: External ear normal.     Nose: Nose normal.  Eyes:     Extraocular Movements: Extraocular movements intact.     Conjunctiva/sclera: Conjunctivae normal.     Pupils: Pupils are equal, round, and reactive to light.  Pulmonary:     Effort: Pulmonary effort is normal. No respiratory distress.  Abdominal:     General: There is no distension.     Palpations: Abdomen is soft. There is no mass.     Tenderness: There is abdominal tenderness (Periumbilical). There is no guarding.  Musculoskeletal:     Cervical back: Normal range of motion and neck supple.  Skin:    General: Skin is warm and dry.  Neurological:     Mental Status: He is alert. Mental status is at baseline.  Psychiatric:        Mood and Affect: Mood normal.        Behavior: Behavior normal.     ED Results / Procedures / Treatments   Labs (all labs ordered are listed, but only abnormal results are displayed) Labs Reviewed  COMPREHENSIVE METABOLIC PANEL - Abnormal; Notable for the following components:      Result Value   Glucose, Bld 108 (*)    ALT 53 (*)    All other components within normal limits  LIPASE, BLOOD  CBC WITH DIFFERENTIAL/PLATELET  URINALYSIS, ROUTINE W REFLEX MICROSCOPIC    EKG None  Radiology CT ABDOMEN PELVIS W  CONTRAST  Result Date: 11/23/2022 CLINICAL DATA:  Bowel obstruction suspected, abdominal pain in the mid abdomen. Constipation. Umbilical hernia comes out when coughing. EXAM: CT ABDOMEN AND PELVIS WITH CONTRAST TECHNIQUE: Multidetector CT imaging of the abdomen and pelvis was performed using the standard protocol following bolus administration of intravenous contrast. RADIATION DOSE REDUCTION: This exam was performed according to the departmental dose-optimization program which includes automated exposure control, adjustment of the mA and/or kV according to patient size and/or use of iterative reconstruction technique. CONTRAST:  75mL OMNIPAQUE IOHEXOL 350 MG/ML SOLN COMPARISON:  10/23/2022 FINDINGS: Lower chest: No acute abnormality. Hepatobiliary: No focal liver abnormality is seen. No gallstones, gallbladder wall thickening, or biliary dilatation. Pancreas: Unremarkable. Spleen: Unremarkable. Adrenals/Urinary Tract: Stable adrenal glands and kidneys. No urinary calculi or hydronephrosis. Unremarkable bladder. Stomach/Bowel: Stomach is within normal limits. No bowel obstruction or bowel wall thickening. Normal appendix. Vascular/Lymphatic: Mild aortic atherosclerotic calcification. No lymphadenopathy. Hazy fat in the central mesentery. Reproductive: Unremarkable. Other: No free intraperitoneal fluid or air. Musculoskeletal: No acute fracture. IMPRESSION: 1. No acute abnormality in the abdomen or pelvis. 2. Mild mesenteric panniculitis. Aortic Atherosclerosis (ICD10-I70.0). Electronically Signed   By: Adrian Moreno M.D.   On: 11/23/2022 00:23    Procedures Procedures    Medications Ordered in ED Medications  iohexol (OMNIPAQUE) 350 MG/ML injection 75 mL (75 mLs Intravenous Contrast Given 11/22/22 2312)    ED Course/ Medical Decision Making/ A&P Clinical Course as of 11/23/22 0037  Thu Nov 22, 2022  2326 Signed out to Adrian Moreno [RP]    Clinical Course User Index [RP] Adrian Baton, MD                                  Medical Decision Making Amount and/or Complexity of Data Reviewed Labs: ordered. Radiology: ordered.  Risk Prescription drug management.   Adrian Moreno is a 52 y.o. male with comorbidities that complicate the patient evaluation including H. pylori who presents emergency department with lower abdominal pain and constipation  Initial Ddx:  SBO, ileus, malignancy, diverticulitis, umbilical hernia  MDM/Course:  Patient presents to the emergency department with lower abdominal pain and constipation.  Does report a strange sensation in his umbilicus occasionally.  Also has had change in stool caliber.  Has periumbilical tenderness to palpation on exam.  No hernia appreciated.  Lab work obtained and is unremarkable.  Awaiting CT scan at this time.  Upon re-evaluation patient was stable and has not required any pain or nausea medication.  This patient presents to  the ED for concern of complaints listed in HPI, this involves an extensive number of treatment options, and is a complaint that carries with it a high risk of complications and morbidity. Disposition including potential need for admission considered.   Dispo: Pending remainder of workup  Additional history obtained from spouse Records reviewed Outpatient Clinic Notes The following labs were independently interpreted: Chemistry and show no acute abnormality I have reviewed the patients home medications and made adjustments as needed  Portions of this note were generated with [employer] dictation software. Dictation errors may occur despite best attempts at proofreading.           Final Clinical Impression(s) / ED Diagnoses Final diagnoses:  Periumbilical abdominal pain  Constipation, unspecified constipation type    Rx / DC Orders ED Discharge Orders     None         Adrian Baton, MD 11/23/22 [phone]

## 2022-11-22 NOTE — Telephone Encounter (Addendum)
Marchelle Folks RN with Access nurse called and pt needs be seen within 4 hrs. Knot pops out at belly button and has to be pushed back in. No available appts at [facility] and pt is going  to ED. Sending note to Audria Nine NP.

## 2022-11-22 NOTE — ED Triage Notes (Signed)
Pt c/o mid abdominal pain and constipationx2d and worse last night. Pt states when he coughs his umbilical area comes out. Pt had a very small BM this morning. Pt states passing some gas, but not much. PT denies N/V. Pt states prior to a few days ago he was having 5-7 times BM's a day.

## 2022-11-23 MED ORDER — NAPROXEN 375 MG PO TABS: 375.0000 mg | ORAL_TABLET | Freq: Two times a day (BID) | ORAL | 0 refills | Status: DC

## 2022-11-23 NOTE — ED Notes (Signed)
Pt states he would like to leave and states "I've been here since 4pm. What more needs to be done? I can't stay here all night." Explained delay to patient and notified MD of situation.

## 2022-11-23 NOTE — ED Provider Notes (Signed)
I assumed care of this patient from previous provider.  Please see their note for further details of history, exam, and MDM.   Briefly patient is a 52 y.o. male who presented periumbilical abdominal pain.  Labs reassuring.  Pending CT scan.  CT notable for mild mesenteric panniculitis.  New from CT 1 month ago.  Etiology undetermined at this time.  Recommended patient follow-up with PCP for repeat imaging in 2 to 3 months.  The patient appears reasonably screened and/or stabilized for discharge and I doubt any other medical condition or other [facility] requiring further screening, evaluation, or treatment in the ED at this time. I have discussed the findings, Dx and Tx plan with the patient/family who expressed understanding and agree(s) with the plan. Discharge instructions discussed at length. The patient/family was given strict return precautions who verbalized understanding of the instructions. No further questions at time of discharge.  Disposition: Discharge  Condition: Good  ED Discharge Orders          Ordered    naproxen (NAPROSYN) 375 MG tablet  2 times daily        11/23/22 0059              Follow Up: Eden Emms, NP [address] [place] [address] [phone]  Schedule an appointment as soon as possible for a visit in 3 days   [facility] Emergency Department at [facility] [address] [place] [address] [phone] Go to  As needed, If symptoms worsen         Xoe Hoe, Amadeo Garnet, MD 11/23/22 0100

## 2022-11-23 NOTE — Discharge Instructions (Addendum)
You were seen for your abdominal pain and constipation in the emergency department.  Your CT scan did find evidence of mesenteric panniculitis (degeneration/inflammation of the fat around your intestines) which is new from the CT scan 1 month ago.  Source of this is unknown.  Please follow-up with the primary care provider for repeat imaging in 2 to 3 months to check for progression or clearance.  For your constipation, please take miralax daily in addition to your fiber that you have been taking.    Check your MyChart online for the results of any tests that had not resulted by the time you left the emergency department.   Follow-up with your primary doctor in 2-3 days regarding your visit.  Follow-up with your gastroenterologist as soon as possible.   Return immediately to the emergency department if you experience any of the following: worsening pain, vomiting, or any other concerning symptoms.    Thank you for visiting our Emergency Department. It was a pleasure taking care of you today.

## 2022-11-26 ENCOUNTER — Encounter: Payer: Self-pay | Admitting: Physician Assistant

## 2022-11-26 ENCOUNTER — Telehealth: Payer: Self-pay | Admitting: Physician Assistant

## 2022-11-26 NOTE — Telephone Encounter (Signed)
Patient called and left a voicemail wanting to speak to the nurse or the provider concerning his office visit. I called patient back to let him know we received his message. I let the patient know I sent the nurse his message. The patient stated that he also sent the a message through mychart. The patient asked me if I can send his mychart message as well.  The is the patient message from [name]. Adrian Moreno. I went to the ER again last night, Sunday. I Have been taking laxatives and was finally able to pass diarrhea, essentially brown water, In spur yesterday. Last night, I started having intense pains on the left side of my stomach along with the stomach swelling and went to the ER. They recommended to stop taking the laxatives. Today I feel like I have to go, but can't get anything out. I have not eaten much so I don't believe I have a lot in my stomach, but the swelling makes me feel like I have to go . I get relief when I lay on my back, but everything worsen as I stand and move around. Most of the pain is in my very low In my abdomen Still not sure what to do.

## 2022-11-26 NOTE — Telephone Encounter (Signed)
Can we call and get him in with me ASAP. Ok to use any available slot. I have 3 open tomorrow

## 2022-11-26 NOTE — Telephone Encounter (Signed)
Left message and sent mc to schedule for 11/5

## 2022-11-27 ENCOUNTER — Ambulatory Visit: Payer: Commercial Managed Care - PPO | Admitting: Nurse Practitioner

## 2022-11-27 VITALS — BP 120/80 | HR 65 | Temp 97.5°F | Ht 69.0 in | Wt 197.4 lb

## 2022-11-27 DIAGNOSIS — R140 Abdominal distension (gaseous): Secondary | ICD-10-CM | POA: Diagnosis not present

## 2022-11-27 DIAGNOSIS — R198 Other specified symptoms and signs involving the digestive system and abdomen: Secondary | ICD-10-CM | POA: Insufficient documentation

## 2022-11-27 DIAGNOSIS — K654 Sclerosing mesenteritis: Secondary | ICD-10-CM | POA: Diagnosis not present

## 2022-11-27 NOTE — Assessment & Plan Note (Signed)
Unsure why patient is having sensation.  Going to reduce MiraLAX to once daily.  Continue following with GI as scheduled/recommended

## 2022-11-27 NOTE — Patient Instructions (Signed)
I would take ibuprofen 1-2 times a day for the nest week with food I would do another round of the probiotics ( Align) Follow up with GI as scheduled

## 2022-11-27 NOTE — Assessment & Plan Note (Signed)
Likely multifactorial will have patient retry on a course of probiotics.

## 2022-11-27 NOTE — Assessment & Plan Note (Signed)
Incidental finding on CT scan.  Patient has already reached out to GI and they recommended watchful waiting at this point with repeat CT scan in a couple months.

## 2022-11-27 NOTE — Progress Notes (Signed)
Acute Office Visit  Subjective:     Patient ID: Adrian Moreno, male    DOB: 10-Aug-1970, 52 y.o.   MRN: [id number]  Chief Complaint  Patient presents with   Abdominal Pain    Pt complains of lower pain that radiates to left side. Pt states when moving around left side is painful but laying down there is no pain. Pt also complains of constipation and bloating. Has not had a bowel movement in 2 days. No appetite due to pain. Pain level 3-4 today. Slight nausea for 2 days.       Patient is in today for  abdominal pain with a history of BPH, skin cancer, OSA, hypogonadism   Patient was seen in the ED on 11/25/2022  Patient underwent a CT scan of that showed messentaric panniculitus States that most of the time he will not have pain with laying down. States that it will happen more with sitting upright and moving around.  States that he has been having the sensatoin that he needs to go to the bathroom. States that he will go and cannot get anything out. No gas or BM Stats that he feels if he could fart he would feel better. State that last flatus was yesterady and it was small. He is belching. States that he has not had a solid BM in over a week. Last BM was Friday and Saturday that was water.  States that he has stopped most laxatives.  States he has been using MiraLAX twice daily  States that he is not eating much.  When he does he gets pain and bloating and the worse pain happens below his umbilicus and above his suprapubic area  Patient is followed by GI and has been treated for H. pylori twice.  He has tried a course of probiotics after last treatment of H. pylori that seemed to improve some symptoms.  He is on dicyclomine but was instructed to stop if is not helping with pain as it can cause constipation.  States that his father had to have part of his colon removed but is unsure of what the reason was.  No other bowel diseases in the family history that he is aware of.  Patient does  mention a "knot" around his bellybutton that will pop out with straining and be tender but he is able to massage it back in.  Review of Systems  Constitutional:  Negative for chills and fever.  Respiratory:  Negative for shortness of breath.   Cardiovascular:  Negative for chest pain.  Gastrointestinal:  Positive for abdominal pain and diarrhea. Negative for blood in stool and constipation.        Objective:    BP 120/80   Pulse 65   Temp (!) 97.5 F (36.4 C) (Oral)   Ht 5\' 9"  (1.753 m)   Wt 197 lb 6.4 oz (89.5 kg)   SpO2 97%   BMI 29.15 kg/m    Physical Exam Vitals and nursing note reviewed.  Constitutional:      Appearance: Normal appearance.  Cardiovascular:     Rate and Rhythm: Normal rate and regular rhythm.     Heart sounds: Normal heart sounds.  Pulmonary:     Effort: Pulmonary effort is normal.     Breath sounds: Normal breath sounds.  Abdominal:     General: Bowel sounds are normal. There is no distension.     Palpations: There is no mass.     Tenderness: There is abdominal tenderness  in the periumbilical area. There is guarding.     Hernia: No hernia is present.  Neurological:     Mental Status: He is alert.     No results found for any visits on 11/27/22.      Assessment & Plan:   Problem List Items Addressed This Visit       Digestive   Mesenteric panniculitis (HCC) - Primary    Incidental finding on CT scan.  Patient has already reached out to GI and they recommended watchful waiting at this point with repeat CT scan in a couple months.        Other   Bloating    Likely multifactorial will have patient retry on a course of probiotics.      Tenesmus    Unsure why patient is having sensation.  Going to reduce MiraLAX to once daily.  Continue following with GI as scheduled/recommended       No orders of the defined types were placed in this encounter.   Return if symptoms worsen or fail to improve.  Audria Nine, NP

## 2022-12-04 ENCOUNTER — Encounter: Payer: Self-pay | Admitting: Nurse Practitioner

## 2022-12-11 ENCOUNTER — Encounter: Payer: Self-pay | Admitting: Nurse Practitioner

## 2022-12-11 DIAGNOSIS — K429 Umbilical hernia without obstruction or gangrene: Secondary | ICD-10-CM

## 2022-12-17 ENCOUNTER — Ambulatory Visit: Payer: Commercial Managed Care - PPO | Admitting: Surgery

## 2022-12-25 ENCOUNTER — Encounter
Admission: RE | Disposition: A | Discharge status: Home / Self Care | Payer: Self-pay | Source: Home / Self Care | Attending: Gastroenterology

## 2022-12-25 ENCOUNTER — Ambulatory Visit: Payer: Commercial Managed Care - PPO | Admitting: Anesthesiology

## 2022-12-25 ENCOUNTER — Encounter: Payer: Self-pay | Admitting: Gastroenterology

## 2022-12-25 ENCOUNTER — Ambulatory Visit
Admission: RE | Admit: 2022-12-25 | Discharge: 2022-12-25 | Disposition: A | Discharge status: Home / Self Care | Payer: Commercial Managed Care - PPO | Attending: Gastroenterology | Admitting: Gastroenterology

## 2022-12-25 DIAGNOSIS — Z86718 Personal history of other venous thrombosis and embolism: Secondary | ICD-10-CM | POA: Insufficient documentation

## 2022-12-25 DIAGNOSIS — K5900 Constipation, unspecified: Secondary | ICD-10-CM | POA: Diagnosis not present

## 2022-12-25 DIAGNOSIS — N400 Enlarged prostate without lower urinary tract symptoms: Secondary | ICD-10-CM | POA: Insufficient documentation

## 2022-12-25 DIAGNOSIS — G4733 Obstructive sleep apnea (adult) (pediatric): Secondary | ICD-10-CM | POA: Insufficient documentation

## 2022-12-25 DIAGNOSIS — K625 Hemorrhage of anus and rectum: Secondary | ICD-10-CM

## 2022-12-25 HISTORY — PX: COLONOSCOPY WITH PROPOFOL: SHX5780

## 2022-12-25 SURGERY — COLONOSCOPY WITH PROPOFOL
Anesthesia: General

## 2022-12-25 MED ORDER — PROPOFOL 500 MG/50ML IV EMUL
INTRAVENOUS | Status: DC | PRN
  Administered 2022-12-25: 175 ug/kg/min via INTRAVENOUS

## 2022-12-25 MED ORDER — LIDOCAINE HCL (CARDIAC) PF 100 MG/5ML IV SOSY
PREFILLED_SYRINGE | INTRAVENOUS | Status: DC | PRN
  Administered 2022-12-25: 50 mg via INTRAVENOUS

## 2022-12-25 MED ORDER — PROPOFOL 10 MG/ML IV BOLUS
INTRAVENOUS | Status: DC | PRN
  Administered 2022-12-25: 100 mg via INTRAVENOUS
  Administered 2022-12-25: 30 mg via INTRAVENOUS

## 2022-12-25 MED ORDER — SODIUM CHLORIDE 0.9 % IV SOLN: INTRAVENOUS | Status: DC

## 2022-12-25 NOTE — Op Note (Signed)
[facility] Gastroenterology Patient Name: Adrian Moreno Procedure Date: 12/25/2022 8:56 AM MRN: [id number] Account #: [id number] Date of Birth: 05/13/1970 Admit Type: Outpatient Age: 52 Room: [facility] ENDO ROOM 2 Gender: Male Note Status: Finalized Instrument Name: Prentice Docker [id number] Procedure:             Colonoscopy Indications:           Constipation Providers:             Wyline Mood MD, MD Referring MD:          Genene Churn. Toney Reil (Referring MD) Medicines:             Monitored Anesthesia Care Complications:         No immediate complications. Procedure:             Pre-Anesthesia Assessment:                        - Prior to the procedure, a History and Physical was                         performed, and patient medications, allergies and                         sensitivities were reviewed. The patient's tolerance                         of previous anesthesia was reviewed.                        - The risks and benefits of the procedure and the                         sedation options and risks were discussed with the                         patient. All questions were answered and informed                         consent was obtained.                        - ASA Grade Assessment: II - A patient with mild                         systemic disease.                        After obtaining informed consent, the colonoscope was                         passed under direct vision. Throughout the procedure,                         the patient's blood pressure, pulse, and oxygen                         saturations were monitored continuously. The                         Colonoscope was introduced through the anus and  advanced to the the cecum, identified by the                         appendiceal orifice. The colonoscopy was performed                         with ease. The patient tolerated the procedure well.                         The quality  of the bowel preparation was excellent.                         The ileocecal valve, appendiceal orifice, and rectum                         were photographed. Findings:      The entire examined colon appeared normal on direct and retroflexion       views. Impression:            - The entire examined colon is normal on direct and                         retroflexion views.                        - No specimens collected. Recommendation:        - Discharge patient to home (with escort).                        - Resume previous diet.                        - Continue present medications.                        - Repeat colonoscopy in 10 years for screening                         purposes. Procedure Code(s):     --- Professional ---                        [phone], Colonoscopy, flexible; diagnostic, including                         collection of specimen(s) by brushing or washing, when                         performed (separate procedure) Diagnosis Code(s):     --- Professional ---                        K59.00, Constipation, unspecified CPT copyright 2022 American Medical Association. All rights reserved. The codes documented in this report are preliminary and upon coder review may  be revised to meet current compliance requirements. Wyline Mood, MD Wyline Mood MD, MD 12/25/2022 9:13:15 AM This report has been signed electronically. Number of Addenda: 0 Note Initiated On: 12/25/2022 8:56 AM Scope Withdrawal Time: 0 hours 7 minutes 55 seconds  Total Procedure Duration: 0 hours 10 minutes 47 seconds  Estimated Blood Loss:  Estimated blood loss: none.  [facility]

## 2022-12-25 NOTE — H&P (Signed)
Wyline Mood, MD [address], [place], [place], [address] [address], [place], [place], [address] Phone: [phone]  Fax: [phone]  Primary Care Physician:  Eden Emms, NP   Pre-Procedure History & Physical: HPI:  Adrian Moreno is a 52 y.o. male is here for an colonoscopy.   Past Medical History:  Diagnosis Date   BPH (benign prostatic hypertrophy)    [place]   Cancer [facility])    Basal cell on face   Hypogonadism in male 04/22/2014   OSA (obstructive sleep apnea) 01/23/2012   did not tolerate CPAP   Superficial vein thrombosis 01/23/2015   after achilles surgery    Past Surgical History:  Procedure Laterality Date   COLONOSCOPY  05/2014   int hem, rpt 10 yrs [facility])   HIP SURGERY Left 2014   torn labrum   NASAL SEPTUM SURGERY  2014   s/p surgery x2 on same day with complications    Prior to Admission medications   Medication Sig Start Date End Date Taking? Authorizing Provider  naproxen (NAPROSYN) 375 MG tablet Take 1 tablet (375 mg total) by mouth 2 (two) times daily. 11/23/22  Yes Cardama, Amadeo Garnet, MD  tamsulosin (FLOMAX) 0.4 MG CAPS capsule Take 1 capsule (0.4 mg total) by mouth daily. 07/06/22  Yes Eden Emms, NP  Vitamin D, Ergocalciferol, (DRISDOL) 1.25 MG (50000 UNIT) CAPS capsule Take 1 capsule (50,000 Units total) by mouth every 7 (seven) days. 04/09/22  Yes Eden Emms, NP  dicyclomine (BENTYL) 10 MG capsule Take 1 capsule (10 mg total) by mouth 3 (three) times daily before meals. 11/15/22 02/13/23  Celso Amy, PA-C  hydrOXYzine (ATARAX/VISTARIL) 25 MG tablet Take 0.5-1 tablets (12.5-25 mg total) by mouth 2 (two) times daily as needed for anxiety. 07/22/15   Eustaquio Boyden, MD  omeprazole (PRILOSEC) 20 MG capsule Take 1 capsule (20 mg total) by mouth 2 (two) times daily for 14 days. 10/08/22 10/22/22  Celso Amy, PA-C  SYRINGE-NEEDLE, DISP, 3 ML (B-D 3CC LUER-LOK SYR 18GX1-1/2) 18G X 1-1/2" 3 ML MISC     [provider]  testosterone cypionate (DEPOTESTOSTERONE CYPIONATE) 200 MG/ML injection INJECT 0.5 MLS (100 MG TOTAL) INTO THE MUSCLE EVERY 14 (FOURTEEN) DAYS. (SINGLE USE VIALS) 08/21/22   Eden Emms, NP    Allergies as of 11/15/2022   (No Known Allergies)    Family History  Problem Relation Age of Onset   Cancer Father 65       prostate (normal PSAs) deceased at 29   Hypertension Father    Diabetes Father    Heart failure Father    CAD Neg Hx    Stroke Neg Hx     Social History   Socioeconomic History   Marital status: Married    Spouse name: Inetta Fermo   Number of children: 4   Years of education: Not on file   Highest education level: Not on file  Occupational History   Not on file  Tobacco Use   Smoking status: Never   Smokeless tobacco: Never  Vaping Use   Vaping status: Never Used  Substance and Sexual Activity   Alcohol use: Yes    Alcohol/week: 0.0 standard drinks of alcohol    Comment: occasional   Drug use: No   Sexual activity: Not on file  Other Topics Concern   Not on file  Social History Narrative   Fulltime: [employer] 32  Sarah 28   Tara 28   Hannah 26      Hobbies:  woodworking and playing guitar    Social Determinants of Corporate investment banker Strain: Not on file  Food Insecurity: Not on file  Transportation Needs: Not on file  Physical Activity: Not on file  Stress: Not on file  Social Connections: Unknown (11/25/2022)   Received from [employer]   Social Network    Social Network: Not on file  Intimate Partner Violence: Not At Risk (11/25/2022)   Received from [facility]   HITS    Over the last 12 months how often did your partner physically hurt you?: Never    Over the last 12 months how often did your partner insult you or talk down to you?: Never    Over the last 12 months how often did your partner threaten you with physical harm?: Never    Over the last 12 months how often did your partner  scream or curse at you?: Never    Review of Systems: See HPI, otherwise negative ROS  Physical Exam: BP 120/84   Pulse 69   Temp (!) 97.3 F (36.3 C) (Temporal)   Resp 17   Ht 5\' 9"  (1.753 m)   Wt 89.6 kg   SpO2 99%   BMI 29.18 kg/m  General:   Alert,  pleasant and cooperative in NAD Head:  Normocephalic and atraumatic. Neck:  Supple; no masses or thyromegaly. Lungs:  Clear throughout to auscultation, normal respiratory effort.    Heart:  +S1, +S2, Regular rate and rhythm, No edema. Abdomen:  Soft, nontender and nondistended. Normal bowel sounds, without guarding, and without rebound.   Neurologic:  Alert and  oriented x4;  grossly normal neurologically.  Impression/Plan: Adrian Moreno is here for an colonoscopy to be performed for rectal bleeding, constipation .  Risks, benefits, limitations, and alternatives regarding  colonoscopy have been reviewed with the patient.  Questions have been answered.  All parties agreeable.   Wyline Mood, MD  12/25/2022, 8:18 AM

## 2022-12-25 NOTE — Anesthesia Postprocedure Evaluation (Signed)
Anesthesia Post Note  Patient: Adrian Moreno  Procedure(s) Performed: COLONOSCOPY WITH PROPOFOL  Patient location during evaluation: PACU Anesthesia Type: General Level of consciousness: awake and alert, oriented and patient cooperative Pain management: pain level controlled Vital Signs Assessment: post-procedure vital signs reviewed and stable Respiratory status: spontaneous breathing, nonlabored ventilation and respiratory function stable Cardiovascular status: blood pressure returned to baseline and stable Postop Assessment: adequate PO intake Anesthetic complications: no   No notable events documented.   Last Vitals:  Vitals:   12/25/22 0925 12/25/22 0935  BP: 105/82 122/81  Pulse: 65 60  Resp: 16 18  Temp:    SpO2: 99% 100%    Last Pain:  Vitals:   12/25/22 0935  TempSrc:   PainSc: 0-No pain                 Reed Breech

## 2022-12-25 NOTE — Transfer of Care (Signed)
Immediate Anesthesia Transfer of Care Note  Patient: Adrian Moreno  Procedure(s) Performed: COLONOSCOPY WITH PROPOFOL  Patient Location: Endoscopy Unit  Anesthesia Type:General  Level of Consciousness: sedated and unresponsive  Airway & Oxygen Therapy: Patient Spontanous Breathing  Post-op Assessment: Report given to RN and Post -op Vital signs reviewed and stable  Post vital signs: Reviewed and stable  Last Vitals:  Vitals Value Taken Time  BP 110/71 12/25/22 0915  Temp 36.2 C 12/25/22 0915  Pulse 65 12/25/22 0916  Resp 13 12/25/22 0915  SpO2 97 % 12/25/22 0916  Vitals shown include unfiled device data.  Last Pain:  Vitals:   12/25/22 0915  TempSrc: Tympanic  PainSc: Asleep         Complications: No notable events documented.

## 2022-12-25 NOTE — Anesthesia Preprocedure Evaluation (Addendum)
Anesthesia Evaluation  Patient identified by MRN, date of birth, ID band Patient awake    Reviewed: Allergy & Precautions, NPO status , Patient's Chart, lab work & pertinent test results  History of Anesthesia Complications Negative for: history of anesthetic complications  Airway Mallampati: II   Neck ROM: Full    Dental no notable dental hx.    Pulmonary sleep apnea    Pulmonary exam normal breath sounds clear to auscultation       Cardiovascular Exercise Tolerance: Good Normal cardiovascular exam Rhythm:Regular Rate:Normal  Hx DVT few years ago, no longer on anticoagulation   Neuro/Psych negative neurological ROS     GI/Hepatic negative GI ROS,,,  Endo/Other  negative endocrine ROS    Renal/GU negative Renal ROS   BPH    Musculoskeletal   Abdominal   Peds  Hematology negative hematology ROS (+)   Anesthesia Other Findings   Reproductive/Obstetrics                             Anesthesia Physical Anesthesia Plan  ASA: 2  Anesthesia Plan: General   Post-op Pain Management:    Induction: Intravenous  PONV Risk Score and Plan: 2 and Propofol infusion, TIVA and Treatment may vary due to age or medical condition  Airway Management Planned: Natural Airway  Additional Equipment:   Intra-op Plan:   Post-operative Plan:   Informed Consent: I have reviewed the patients History and Physical, chart, labs and discussed the procedure including the risks, benefits and alternatives for the proposed anesthesia with the patient or authorized representative who has indicated his/her understanding and acceptance.       Plan Discussed with: CRNA  Anesthesia Plan Comments: (LMA/GETA backup discussed.  Patient consented for risks of anesthesia including but not limited to:  - adverse reactions to medications - damage to eyes, teeth, lips or other oral mucosa - nerve damage due to  positioning  - sore throat or hoarseness - damage to heart, brain, nerves, lungs, other parts of body or loss of life  Informed patient about role of CRNA in peri- and intra-operative care.  Patient voiced understanding.)       Anesthesia Quick Evaluation

## 2022-12-25 NOTE — Anesthesia Procedure Notes (Signed)
Date/Time: 12/25/2022 8:56 AM  Performed by: Ginger Carne, CRNAPre-anesthesia Checklist: Patient identified, Emergency Drugs available, Suction available, Patient being monitored and Timeout performed Patient Re-evaluated:Patient Re-evaluated prior to induction Oxygen Delivery Method: Nasal cannula Preoxygenation: Pre-oxygenation with 100% oxygen Induction Type: IV induction

## 2022-12-26 ENCOUNTER — Encounter: Payer: Self-pay | Admitting: Gastroenterology

## 2022-12-26 ENCOUNTER — Ambulatory Visit (INDEPENDENT_AMBULATORY_CARE_PROVIDER_SITE_OTHER): Payer: Commercial Managed Care - PPO | Admitting: Surgery

## 2022-12-26 VITALS — BP 99/72 | HR 67 | Temp 98.5°F | Ht 69.0 in | Wt 202.0 lb

## 2022-12-26 DIAGNOSIS — K429 Umbilical hernia without obstruction or gangrene: Secondary | ICD-10-CM

## 2022-12-26 NOTE — Progress Notes (Signed)
12/26/2022  Reason for Visit:  Umbilical hernia  Requesting Provider:  Mordecai Maes, NP  History of Present Illness: Adrian Moreno is a 52 y.o. male presenting for evaluation of an umbilical hernia.  The patient reports feeling a few months ago with popping sensation at the umbilicus associated with significant pain.  He was able to push that area back again and reports that since then he has had some mild tenderness but no worsening pain itself.  Reports that the area bulges intermittently but he is able to push it in or if he lies down the gauze packing as well.  He does lead an active life and feels that the area is more bothersome when he is bending down or doing more strenuous activity.  He presented to the emergency room twice in October for abdominal pain and neither CT scan mention the findings of an umbilical hernia although the it is visible on both CT scan images.  Both he did have some mild component of mesenteric panniculitis with a small area of misty mesentery in the central abdomen.  He reports that he may have this discomfort about once a week and takes ibuprofen for it which keeps it under control.  He has not had any prior abdominal surgeries.  Past Medical History: Past Medical History:  Diagnosis Date   BPH (benign prostatic hypertrophy)    Wrenn   Cancer [facility])    Basal cell on face   Hypogonadism in male 04/22/2014   OSA (obstructive sleep apnea) 01/23/2012   did not tolerate CPAP   Superficial vein thrombosis 01/23/2015   after achilles surgery     Past Surgical History: Past Surgical History:  Procedure Laterality Date   COLONOSCOPY  05/23/2014   int hem, rpt 10 yrs [facility] Fire[facility])   COLONOSCOPY WITH PROPOFOL N/A 12/25/2022   Procedure: COLONOSCOPY WITH PROPOFOL;  Surgeon: Wyline Mood, MD;  Location: [facility] ENDOSCOPY;  Service: Gastroenterology;  Laterality: N/A;   HIP SURGERY Left 01/23/2012   torn labrum   NASAL SEPTUM SURGERY  01/23/2012   s/p surgery x2 on same day  with complications   TENOTOMY ACHILLES TENDON Bilateral     Home Medications: Prior to Admission medications   Medication Sig Start Date End Date Taking? Authorizing Provider  SYRINGE-NEEDLE, DISP, 3 ML (B-D 3CC LUER-LOK SYR 18GX1-1/2) 18G X 1-1/2" 3 ML MISC    Yes [provider]  tamsulosin (FLOMAX) 0.4 MG CAPS capsule Take 1 capsule (0.4 mg total) by mouth daily. 07/06/22  Yes Eden Emms, NP  testosterone cypionate (DEPOTESTOSTERONE CYPIONATE) 200 MG/ML injection INJECT 0.5 MLS (100 MG TOTAL) INTO THE MUSCLE EVERY 14 (FOURTEEN) DAYS. (SINGLE USE VIALS) 08/21/22  Yes Eden Emms, NP  Vitamin D, Ergocalciferol, (DRISDOL) 1.25 MG (50000 UNIT) CAPS capsule Take 1 capsule (50,000 Units total) by mouth every 7 (seven) days. 04/09/22  Yes Eden Emms, NP    Allergies: No Known Allergies  Social History:  reports that he has never smoked. He has never been exposed to tobacco smoke. He has never used smokeless tobacco. He reports current alcohol use. He reports that he does not use drugs.   Family History: Family History  Problem Relation Age of Onset   Cancer Father 76       prostate (normal PSAs) deceased at 25   Hypertension Father    Diabetes Father    Heart failure Father    CAD Neg Hx    Stroke Neg Hx     Review  of Systems: Review of Systems  Constitutional:  Negative for chills and fever.  HENT:  Negative for hearing loss.   Respiratory:  Negative for shortness of breath.   Cardiovascular:  Negative for chest pain.  Gastrointestinal:  Positive for abdominal pain and constipation. Negative for nausea and vomiting.  Genitourinary:  Negative for dysuria.  Musculoskeletal:  Negative for myalgias.  Skin:  Negative for rash.  Neurological:  Negative for dizziness.  Psychiatric/Behavioral:  Negative for depression.     Physical Exam BP 99/72   Pulse 67   Temp 98.5 F (36.9 C)   Ht 5\' 9"  (1.753 m)   Wt 202 lb (91.6 kg)   SpO2 96%   BMI 29.83 kg/m   CONSTITUTIONAL: No acute distress, well-nourished HEENT:  Normocephalic, atraumatic, extraocular motion intact. NECK: Trachea is midline, and there is no jugular venous distension.  RESPIRATORY:  Lungs are clear, and breath sounds are equal bilaterally. Normal respiratory effort without pathologic use of accessory muscles. CARDIOVASCULAR: Heart is regular without murmurs, gallops, or rubs. GI: The abdomen is soft, nondistended, with mild tenderness to palpation at the umbilicus.  The patient has a reducible 1 cm umbilical hernia containing fatty tissue.  No palpable hernias in either groin.   MUSCULOSKELETAL:  Normal muscle strength and tone in all four extremities.  No peripheral edema or cyanosis. SKIN: Skin turgor is normal. There are no pathologic skin lesions.  NEUROLOGIC:  Motor and sensation is grossly normal.  Cranial nerves are grossly intact. PSYCH:  Alert and oriented to person, place and time. Affect is normal.  Laboratory Analysis: Labs from 11/25/2022: Sodium 140, potassium 3.8, chloride 104, CO2 23, BUN 19, creatinine 0.94.  LFTs within normal limits.  WBC 7.1, hemoglobin 16.8, hematocrit 49.5, platelets 190.  Imaging: CT abdomen/pelvis on 11/22/2022: IMPRESSION: 1. No acute abnormality in the abdomen or pelvis. 2. Mild mesenteric panniculitis.   Aortic Atherosclerosis (ICD10-I70.0).  Assessment and Plan: This is a 52 y.o. male with an umbilical hernia.  - Discussed with patient the findings on his CT scan images.  Even though there is no mention on the report for an umbilical hernia, he does have an umbilical hernia present containing fatty tissue.  On exam, findings are the same.  Discussed with patient the categories of hernias going from reducible to incarcerated to strangulated.  Discussed with him the natural progression as of hernia can get better with time and potentially cause more symptoms.  Given that he currently is having symptoms of the reducible, would  recommend proceeding with surgical repair.  He is in agreement. - Discussed with the patient options for either open approach versus robotic approach and after further discussion, he has opted for robotic approach.  Reviewed with the patient then the plan for robotic assisted umbilical hernia repair and discussed the surgery at length including the planned incisions, risks of bleeding, infection, injury to surrounding structures, that this would be an outpatient procedure, postoperative activity restrictions, pain control, and he is willing to proceed. - We will schedule the patient for surgery on 01/04/2023.  All of his questions have been answered.  I spent 55 minutes dedicated to the care of this patient on the date of this encounter to include pre-visit review of records, face-to-face time with the patient discussing diagnosis and management, and any post-visit coordination of care.   Howie Ill, MD [facility]

## 2022-12-26 NOTE — Patient Instructions (Signed)
 You have requested for your Umbilical Hernia be repaired. This will be scheduled with Dr. Aleen Campi at [facility].   Please see your (blue)pre-care sheet for information. Our surgery scheduler will call you to verify surgery date and to go over information.   You will need to arrange to be off work for 1-2 weeks but will have to have a lifting restriction of no more than 15 lbs for 6 weeks following your surgery. If you have FMLA or disability paperwork that needs filled out you may drop this off at our office or this can be faxed to [phone].     Umbilical Hernia, Adult A hernia is a bulge of tissue that pushes through an opening between muscles. An umbilical hernia happens in the abdomen, near the belly button (umbilicus). The hernia may contain tissues from the small intestine, large intestine, or fatty tissue covering the intestines (omentum). Umbilical hernias in adults tend to get worse over time, and they require surgical treatment. There are several types of umbilical hernias. You may have: A hernia located just above or below the umbilicus (indirect hernia). This is the most common type of umbilical hernia in adults. A hernia that forms through an opening formed by the umbilicus (direct hernia). A hernia that comes and goes (reducible hernia). A reducible hernia may be visible only when you strain, lift something heavy, or cough. This type of hernia can be pushed back into the abdomen (reduced). A hernia that traps abdominal tissue inside the hernia (incarcerated hernia). This type of hernia cannot be reduced. A hernia that cuts off blood flow to the tissues inside the hernia (strangulated hernia). The tissues can start to die if this happens. This type of hernia requires emergency treatment.  What are the causes? An umbilical hernia happens when tissue inside the abdomen presses on a weak area of the abdominal muscles. What increases the risk? You may have a  greater risk of this condition if you: Are obese. Have had several pregnancies. Have a buildup of fluid inside your abdomen (ascites). Have had surgery that weakens the abdominal muscles.  What are the signs or symptoms? The main symptom of this condition is a painless bulge at or near the belly button. A reducible hernia may be visible only when you strain, lift something heavy, or cough. Other symptoms may include: Dull pain. A feeling of pressure.  Symptoms of a strangulated hernia may include: Pain that gets increasingly worse. Nausea and vomiting. Pain when pressing on the hernia. Skin over the hernia becoming red or purple. Constipation. Blood in the stool.  How is this diagnosed? This condition may be diagnosed based on: A physical exam. You may be asked to cough or strain while standing. These actions increase the pressure inside your abdomen and force the hernia through the opening in your muscles. Your health care provider may try to reduce the hernia by pressing on it. Your symptoms and medical history.  How is this treated? Surgery is the only treatment for an umbilical hernia. Surgery for a strangulated hernia is done as soon as possible. If you have a small hernia that is not incarcerated, you may need to lose weight before having surgery. Follow these instructions at home: Lose weight, if told by your health care provider. Do not try to push the hernia back in. Watch your hernia for any changes in color or size. Tell your health care provider if any changes occur. You may need to avoid activities  that increase pressure on your hernia. Do not lift anything that is heavier than 10 lb (4.5 kg) until your health care provider says that this is safe. Take over-the-counter and prescription medicines only as told by your health care provider. Keep all follow-up visits as told by your health care provider. This is important. Contact a health care provider if: Your hernia  gets larger. Your hernia becomes painful. Get help right away if: You develop sudden, severe pain near the area of your hernia. You have pain as well as nausea or vomiting. You have pain and the skin over your hernia changes color. You develop a fever. This information is not intended to replace advice given to you by your health care provider. Make sure you discuss any questions you have with your health care provider. Document Released: 06/10/2015 Document Revised: 09/11/2015 Document Reviewed: 06/10/2015 Elsevier Interactive Patient Education  [employer].

## 2022-12-26 NOTE — H&P (View-Only) (Signed)
 12/26/2022  Reason for Visit:  Umbilical hernia  Requesting Provider:  Mordecai Maes, NP  History of Present Illness: Adrian Moreno is a 52 y.o. male presenting for evaluation of an umbilical hernia.  The patient reports feeling a few months ago with popping sensation at the umbilicus associated with significant pain.  He was able to push that area back again and reports that since then he has had some mild tenderness but no worsening pain itself.  Reports that the area bulges intermittently but he is able to push it in or if he lies down the gauze packing as well.  He does lead an active life and feels that the area is more bothersome when he is bending down or doing more strenuous activity.  He presented to the emergency room twice in October for abdominal pain and neither CT scan mention the findings of an umbilical hernia although the it is visible on both CT scan images.  Both he did have some mild component of mesenteric panniculitis with a small area of misty mesentery in the central abdomen.  He reports that he may have this discomfort about once a week and takes ibuprofen for it which keeps it under control.  He has not had any prior abdominal surgeries.  Past Medical History: Past Medical History:  Diagnosis Date   BPH (benign prostatic hypertrophy)    Wrenn   Cancer [facility])    Basal cell on face   Hypogonadism in male 04/22/2014   OSA (obstructive sleep apnea) 01/23/2012   did not tolerate CPAP   Superficial vein thrombosis 01/23/2015   after achilles surgery     Past Surgical History: Past Surgical History:  Procedure Laterality Date   COLONOSCOPY  05/23/2014   int hem, rpt 10 yrs [facility] Fire[facility])   COLONOSCOPY WITH PROPOFOL N/A 12/25/2022   Procedure: COLONOSCOPY WITH PROPOFOL;  Surgeon: Wyline Mood, MD;  Location: [facility] ENDOSCOPY;  Service: Gastroenterology;  Laterality: N/A;   HIP SURGERY Left 01/23/2012   torn labrum   NASAL SEPTUM SURGERY  01/23/2012   s/p surgery x2 on same day  with complications   TENOTOMY ACHILLES TENDON Bilateral     Home Medications: Prior to Admission medications   Medication Sig Start Date End Date Taking? Authorizing Provider  SYRINGE-NEEDLE, DISP, 3 ML (B-D 3CC LUER-LOK SYR 18GX1-1/2) 18G X 1-1/2" 3 ML MISC    Yes [provider]  tamsulosin (FLOMAX) 0.4 MG CAPS capsule Take 1 capsule (0.4 mg total) by mouth daily. 07/06/22  Yes Eden Emms, NP  testosterone cypionate (DEPOTESTOSTERONE CYPIONATE) 200 MG/ML injection INJECT 0.5 MLS (100 MG TOTAL) INTO THE MUSCLE EVERY 14 (FOURTEEN) DAYS. (SINGLE USE VIALS) 08/21/22  Yes Eden Emms, NP  Vitamin D, Ergocalciferol, (DRISDOL) 1.25 MG (50000 UNIT) CAPS capsule Take 1 capsule (50,000 Units total) by mouth every 7 (seven) days. 04/09/22  Yes Eden Emms, NP    Allergies: No Known Allergies  Social History:  reports that he has never smoked. He has never been exposed to tobacco smoke. He has never used smokeless tobacco. He reports current alcohol use. He reports that he does not use drugs.   Family History: Family History  Problem Relation Age of Onset   Cancer Father 76       prostate (normal PSAs) deceased at 25   Hypertension Father    Diabetes Father    Heart failure Father    CAD Neg Hx    Stroke Neg Hx     Review  of Systems: Review of Systems  Constitutional:  Negative for chills and fever.  HENT:  Negative for hearing loss.   Respiratory:  Negative for shortness of breath.   Cardiovascular:  Negative for chest pain.  Gastrointestinal:  Positive for abdominal pain and constipation. Negative for nausea and vomiting.  Genitourinary:  Negative for dysuria.  Musculoskeletal:  Negative for myalgias.  Skin:  Negative for rash.  Neurological:  Negative for dizziness.  Psychiatric/Behavioral:  Negative for depression.     Physical Exam BP 99/72   Pulse 67   Temp 98.5 F (36.9 C)   Ht 5\' 9"  (1.753 m)   Wt 202 lb (91.6 kg)   SpO2 96%   BMI 29.83 kg/m   CONSTITUTIONAL: No acute distress, well-nourished HEENT:  Normocephalic, atraumatic, extraocular motion intact. NECK: Trachea is midline, and there is no jugular venous distension.  RESPIRATORY:  Lungs are clear, and breath sounds are equal bilaterally. Normal respiratory effort without pathologic use of accessory muscles. CARDIOVASCULAR: Heart is regular without murmurs, gallops, or rubs. GI: The abdomen is soft, nondistended, with mild tenderness to palpation at the umbilicus.  The patient has a reducible 1 cm umbilical hernia containing fatty tissue.  No palpable hernias in either groin.   MUSCULOSKELETAL:  Normal muscle strength and tone in all four extremities.  No peripheral edema or cyanosis. SKIN: Skin turgor is normal. There are no pathologic skin lesions.  NEUROLOGIC:  Motor and sensation is grossly normal.  Cranial nerves are grossly intact. PSYCH:  Alert and oriented to person, place and time. Affect is normal.  Laboratory Analysis: Labs from 11/25/2022: Sodium 140, potassium 3.8, chloride 104, CO2 23, BUN 19, creatinine 0.94.  LFTs within normal limits.  WBC 7.1, hemoglobin 16.8, hematocrit 49.5, platelets 190.  Imaging: CT abdomen/pelvis on 11/22/2022: IMPRESSION: 1. No acute abnormality in the abdomen or pelvis. 2. Mild mesenteric panniculitis.   Aortic Atherosclerosis (ICD10-I70.0).  Assessment and Plan: This is a 52 y.o. male with an umbilical hernia.  - Discussed with patient the findings on his CT scan images.  Even though there is no mention on the report for an umbilical hernia, he does have an umbilical hernia present containing fatty tissue.  On exam, findings are the same.  Discussed with patient the categories of hernias going from reducible to incarcerated to strangulated.  Discussed with him the natural progression as of hernia can get better with time and potentially cause more symptoms.  Given that he currently is having symptoms of the reducible, would  recommend proceeding with surgical repair.  He is in agreement. - Discussed with the patient options for either open approach versus robotic approach and after further discussion, he has opted for robotic approach.  Reviewed with the patient then the plan for robotic assisted umbilical hernia repair and discussed the surgery at length including the planned incisions, risks of bleeding, infection, injury to surrounding structures, that this would be an outpatient procedure, postoperative activity restrictions, pain control, and he is willing to proceed. - We will schedule the patient for surgery on 01/04/2023.  All of his questions have been answered.  I spent 55 minutes dedicated to the care of this patient on the date of this encounter to include pre-visit review of records, face-to-face time with the patient discussing diagnosis and management, and any post-visit coordination of care.   Howie Ill, MD [facility]

## 2022-12-27 ENCOUNTER — Telehealth: Payer: Self-pay | Admitting: Surgery

## 2022-12-27 NOTE — Telephone Encounter (Signed)
Patient has been advised of Pre-Admission date/time, and Surgery date at [facility].  Surgery Date: 01/04/23 Preadmission Testing Date: 12/31/22 (phone 1p-4p)  Patient has been made aware to call [phone], between 1-3:00pm the day before surgery, to find out what time to arrive for surgery.

## 2022-12-31 ENCOUNTER — Other Ambulatory Visit: Payer: Self-pay

## 2022-12-31 ENCOUNTER — Encounter
Admission: RE | Admit: 2022-12-31 | Discharge: 2022-12-31 | Disposition: A | Discharge status: Home / Self Care | Payer: Commercial Managed Care - PPO | Source: Ambulatory Visit | Attending: Surgery | Admitting: Surgery

## 2022-12-31 NOTE — Patient Instructions (Signed)
Your procedure is scheduled on: Friday 01/04/23  To find out your arrival time, please call [phone] between 1PM - 3PM on: Thursday 01/03/23   Report to the Registration Desk on the 1st floor of the [facility]. FREE Valet parking is available.  If your arrival time is 6:00 am, do not arrive before that time as the [facility] entrance doors do not open until 6:00 am.  REMEMBER: Instructions that are not followed completely may result in serious medical risk, up to and including death; or upon the discretion of your surgeon and anesthesiologist your surgery may need to be rescheduled.  Do not eat food after midnight the night before surgery.  No gum chewing or hard candies.  You may however, drink CLEAR liquids up to 2 hours before you are scheduled to arrive for your surgery. Do not drink anything within 2 hours of your scheduled arrival time.  Clear liquids include: - water  - apple juice without pulp - gatorade (not RED colors) - black coffee or tea (Do NOT add milk or creamers to the coffee or tea) Do NOT drink anything that is not on this list.  Type 1 and Type 2 diabetics should only drink water.  One week prior to surgery: Stop Anti-inflammatories (NSAIDS) such as Advil, Aleve, Ibuprofen, Motrin, Naproxen, Naprosyn and Aspirin based products such as Excedrin, Goody's Powder, BC Powder. You may however, continue to take Tylenol if needed for pain up until the day of surgery.  Stop ANY OVER THE COUNTER supplements and vitamins until after surgery.  Continue taking all prescribed medications.   TAKE ONLY THESE MEDICATIONS THE MORNING OF SURGERY WITH A SIP OF WATER:  none  No Alcohol for 24 hours before or after surgery.  No Smoking including e-cigarettes for 24 hours before surgery.  No chewable tobacco products for at least 6 hours before surgery.  No nicotine patches on the day of surgery.  Do not use any "recreational" drugs for at least a week (preferably 2  weeks) before your surgery.  Please be advised that the combination of cocaine and anesthesia may have negative outcomes, up to and including death. If you test positive for cocaine, your surgery will be cancelled.  On the morning of surgery brush your teeth with toothpaste and water, you may rinse your mouth with mouthwash if you wish. Do not swallow any toothpaste or mouthwash.  Use CHG Soap or wipes as directed on instruction sheet. Can be picked up in our office in the [facility] at [address].  Do not wear lotions, powders, or perfumes.   Do not shave body hair from the neck down 48 hours before surgery.  Wear clean comfortable clothing (specific to your surgery type) to the hospital.  Do not wear jewelry, make-up, hairpins, clips or nail polish.  For welded (permanent) jewelry: bracelets, anklets, waist bands, etc.  Please have this removed prior to surgery.  If it is not removed, there is a chance that hospital personnel will need to cut it off on the day of surgery. Contact lenses, hearing aids and dentures may not be worn into surgery.  Do not bring valuables to the hospital. [facility] is not responsible for any missing/lost belongings or valuables.   Notify your doctor if there is any change in your medical condition (cold, fever, infection).  If you are being discharged the day of surgery, you will not be allowed to drive home. You will need  a responsible individual to drive you home and stay with you for 24 hours after surgery.   If you are taking public transportation, you will need to have a responsible individual with you.  If you are being admitted to the hospital overnight, leave your suitcase in the car. After surgery it may be brought to your room.  In case of increased patient census, it may be necessary for you, the patient, to continue your postoperative care in the Same Day Surgery department.  After surgery, you can help  prevent lung complications by doing breathing exercises.  Take deep breaths and cough every 1-2 hours. Your doctor may order a device called an Incentive Spirometer to help you take deep breaths. When coughing or sneezing, hold a pillow firmly against your incision with both hands. This is called "splinting." Doing this helps protect your incision. It also decreases belly discomfort.  Surgery Visitation Policy:  Patients undergoing a surgery or procedure may have two family members or support persons with them as long as the person is not COVID-19 positive or experiencing its symptoms.   Inpatient Visitation:    Visiting hours are 7 a.m. to 8 p.m. Up to four visitors are allowed at one time in a patient room. The visitors may rotate out with other people during the day. One designated support person (adult) may remain overnight.  Please call the Pre-admissions Testing Dept. at [phone] if you have any questions about these instructions.     Preparing for Surgery with CHLORHEXIDINE GLUCONATE (CHG) Soap  Chlorhexidine Gluconate (CHG) Soap  o An antiseptic cleaner that kills germs and bonds with the skin to continue killing germs even after washing  o Used for showering the night before surgery and morning of surgery  Before surgery, you can play an important role by reducing the number of germs on your skin.  CHG (Chlorhexidine gluconate) soap is an antiseptic cleanser which kills germs and bonds with the skin to continue killing germs even after washing.  Please do not use if you have an allergy to CHG or antibacterial soaps. If your skin becomes reddened/irritated stop using the CHG.  1. Shower the NIGHT BEFORE SURGERY and the MORNING OF SURGERY with CHG soap.  2. If you choose to wash your hair, wash your hair first as usual with your normal shampoo.  3. After shampooing, rinse your hair and body thoroughly to remove the shampoo.  4. Use CHG as you would any other liquid  soap. You can apply CHG directly to the skin and wash gently with a scrungie or a clean washcloth.  5. Apply the CHG soap to your body only from the neck down. Do not use on open wounds or open sores. Avoid contact with your eyes, ears, mouth, and genitals (private parts). Wash face and genitals (private parts) with your normal soap.  6. Wash thoroughly, paying special attention to the area where your surgery will be performed.  7. Thoroughly rinse your body with warm water.  8. Do not shower/wash with your normal soap after using and rinsing off the CHG soap.  9. Pat yourself dry with a clean towel.  10. Wear clean pajamas to bed the night before surgery.  12. Place clean sheets on your bed the night of your first shower and do not sleep with pets.  13. Shower again with the CHG soap on the day of surgery prior to arriving at the hospital.  14. Do not apply any deodorants/lotions/powders.  15.  Please wear clean clothes to the hospital.

## 2023-01-04 ENCOUNTER — Other Ambulatory Visit: Payer: Self-pay

## 2023-01-04 ENCOUNTER — Ambulatory Visit: Payer: Commercial Managed Care - PPO | Admitting: Urgent Care

## 2023-01-04 ENCOUNTER — Ambulatory Visit: Payer: Commercial Managed Care - PPO

## 2023-01-04 ENCOUNTER — Encounter
Admission: RE | Disposition: A | Discharge status: Home / Self Care | Payer: Self-pay | Source: Home / Self Care | Attending: Surgery

## 2023-01-04 ENCOUNTER — Encounter: Payer: Self-pay | Admitting: Surgery

## 2023-01-04 ENCOUNTER — Ambulatory Visit
Admission: RE | Admit: 2023-01-04 | Discharge: 2023-01-04 | Disposition: A | Discharge status: Home / Self Care | Payer: Commercial Managed Care - PPO | Attending: Surgery | Admitting: Surgery

## 2023-01-04 DIAGNOSIS — K429 Umbilical hernia without obstruction or gangrene: Secondary | ICD-10-CM | POA: Insufficient documentation

## 2023-01-04 DIAGNOSIS — G4733 Obstructive sleep apnea (adult) (pediatric): Secondary | ICD-10-CM | POA: Insufficient documentation

## 2023-01-04 SURGERY — REPAIR, HERNIA, UMBILICAL, ROBOT-ASSISTED
Anesthesia: General

## 2023-01-04 MED ORDER — ACETAMINOPHEN 500 MG PO TABS: 1000.0000 mg | ORAL_TABLET | Freq: Four times a day (QID) | ORAL | Status: DC | PRN

## 2023-01-04 MED ORDER — DEXAMETHASONE SODIUM PHOSPHATE 10 MG/ML IJ SOLN
INTRAMUSCULAR | Status: DC | PRN
  Administered 2023-01-04: 10 mg via INTRAVENOUS

## 2023-01-04 MED ORDER — SODIUM CHLORIDE (PF) 0.9 % IJ SOLN
INTRAMUSCULAR | Status: DC | PRN
  Administered 2023-01-04: 60 mL via SURGICAL_CAVITY

## 2023-01-04 MED ORDER — ROCURONIUM BROMIDE 10 MG/ML (PF) SYRINGE
PREFILLED_SYRINGE | INTRAVENOUS | Status: AC
  Filled 2023-01-04: qty 10

## 2023-01-04 MED ORDER — OXYCODONE HCL 5 MG/5ML PO SOLN: 5.0000 mg | Freq: Once | ORAL | Status: AC | PRN

## 2023-01-04 MED ORDER — LIDOCAINE HCL (PF) 2 % IJ SOLN
INTRAMUSCULAR | Status: AC
  Filled 2023-01-04: qty 5

## 2023-01-04 MED ORDER — CEFAZOLIN SODIUM-DEXTROSE 2-4 GM/100ML-% IV SOLN
2.0000 g | INTRAVENOUS | Status: AC
  Administered 2023-01-04: 2 g via INTRAVENOUS

## 2023-01-04 MED ORDER — CHLORHEXIDINE GLUCONATE CLOTH 2 % EX PADS: 6.0000 | MEDICATED_PAD | Freq: Once | CUTANEOUS | Status: DC

## 2023-01-04 MED ORDER — CHLORHEXIDINE GLUCONATE 0.12 % MT SOLN
15.0000 mL | Freq: Once | OROMUCOSAL | Status: AC
  Administered 2023-01-04: 15 mL via OROMUCOSAL

## 2023-01-04 MED ORDER — DEXAMETHASONE SODIUM PHOSPHATE 10 MG/ML IJ SOLN
INTRAMUSCULAR | Status: AC
  Filled 2023-01-04: qty 1

## 2023-01-04 MED ORDER — ACETAMINOPHEN 500 MG PO TABS
1000.0000 mg | ORAL_TABLET | ORAL | Status: AC
  Administered 2023-01-04: 1000 mg via ORAL

## 2023-01-04 MED ORDER — PROPOFOL 10 MG/ML IV BOLUS
INTRAVENOUS | Status: AC
  Filled 2023-01-04: qty 20

## 2023-01-04 MED ORDER — ACETAMINOPHEN 500 MG PO TABS
ORAL_TABLET | ORAL | Status: AC
  Filled 2023-01-04: qty 2

## 2023-01-04 MED ORDER — FENTANYL CITRATE (PF) 100 MCG/2ML IJ SOLN
INTRAMUSCULAR | Status: AC
  Filled 2023-01-04: qty 2

## 2023-01-04 MED ORDER — ONDANSETRON HCL 4 MG/2ML IJ SOLN
INTRAMUSCULAR | Status: AC
  Filled 2023-01-04: qty 2

## 2023-01-04 MED ORDER — ORAL CARE MOUTH RINSE: 15.0000 mL | Freq: Once | OROMUCOSAL | Status: AC

## 2023-01-04 MED ORDER — BUPIVACAINE LIPOSOME 1.3 % IJ SUSP: 20.0000 mL | Freq: Once | INTRAMUSCULAR | Status: DC

## 2023-01-04 MED ORDER — BUPIVACAINE-EPINEPHRINE (PF) 0.5% -1:200000 IJ SOLN
INTRAMUSCULAR | Status: AC
  Filled 2023-01-04: qty 30

## 2023-01-04 MED ORDER — MIDAZOLAM HCL 2 MG/2ML IJ SOLN
INTRAMUSCULAR | Status: DC | PRN
  Administered 2023-01-04: 2 mg via INTRAVENOUS

## 2023-01-04 MED ORDER — KETOROLAC TROMETHAMINE 30 MG/ML IJ SOLN
INTRAMUSCULAR | Status: DC | PRN
  Administered 2023-01-04: 30 mg via INTRAVENOUS

## 2023-01-04 MED ORDER — PROPOFOL 10 MG/ML IV BOLUS
INTRAVENOUS | Status: DC | PRN
  Administered 2023-01-04: 160 mg via INTRAVENOUS
  Administered 2023-01-04: 50 mg via INTRAVENOUS

## 2023-01-04 MED ORDER — HYDROMORPHONE HCL 1 MG/ML IJ SOLN
INTRAMUSCULAR | Status: AC
  Filled 2023-01-04: qty 1

## 2023-01-04 MED ORDER — OXYCODONE HCL 5 MG PO TABS
ORAL_TABLET | ORAL | Status: AC
  Filled 2023-01-04: qty 1

## 2023-01-04 MED ORDER — FENTANYL CITRATE (PF) 100 MCG/2ML IJ SOLN
INTRAMUSCULAR | Status: DC | PRN
  Administered 2023-01-04: 100 ug via INTRAVENOUS

## 2023-01-04 MED ORDER — GABAPENTIN 300 MG PO CAPS
300.0000 mg | ORAL_CAPSULE | ORAL | Status: AC
  Administered 2023-01-04: 300 mg via ORAL

## 2023-01-04 MED ORDER — CEFAZOLIN SODIUM-DEXTROSE 2-4 GM/100ML-% IV SOLN
INTRAVENOUS | Status: AC
  Filled 2023-01-04: qty 100

## 2023-01-04 MED ORDER — 0.9 % SODIUM CHLORIDE (POUR BTL) OPTIME
TOPICAL | Status: DC | PRN
  Administered 2023-01-04: 500 mL

## 2023-01-04 MED ORDER — BUPIVACAINE-EPINEPHRINE (PF) 0.5% -1:200000 IJ SOLN: INTRAMUSCULAR | Status: DC | PRN

## 2023-01-04 MED ORDER — SUGAMMADEX SODIUM 200 MG/2ML IV SOLN
INTRAVENOUS | Status: DC | PRN
  Administered 2023-01-04: 300 mg via INTRAVENOUS

## 2023-01-04 MED ORDER — CHLORHEXIDINE GLUCONATE CLOTH 2 % EX PADS
6.0000 | MEDICATED_PAD | Freq: Once | CUTANEOUS | Status: AC
  Administered 2023-01-04: 6 via TOPICAL

## 2023-01-04 MED ORDER — MIDAZOLAM HCL 2 MG/2ML IJ SOLN
INTRAMUSCULAR | Status: AC
  Filled 2023-01-04: qty 2

## 2023-01-04 MED ORDER — IBUPROFEN 600 MG PO TABS: 600.0000 mg | ORAL_TABLET | Freq: Three times a day (TID) | ORAL | 1 refills | Status: DC | PRN

## 2023-01-04 MED ORDER — CHLORHEXIDINE GLUCONATE 0.12 % MT SOLN
OROMUCOSAL | Status: AC
  Filled 2023-01-04: qty 15

## 2023-01-04 MED ORDER — OXYCODONE HCL 5 MG PO TABS: 5.0000 mg | ORAL_TABLET | ORAL | 0 refills | Status: DC | PRN

## 2023-01-04 MED ORDER — GABAPENTIN 300 MG PO CAPS
ORAL_CAPSULE | ORAL | Status: AC
  Filled 2023-01-04: qty 1

## 2023-01-04 MED ORDER — LACTATED RINGERS IV SOLN: INTRAVENOUS | Status: DC

## 2023-01-04 MED ORDER — LIDOCAINE HCL (CARDIAC) PF 100 MG/5ML IV SOSY
PREFILLED_SYRINGE | INTRAVENOUS | Status: DC | PRN
  Administered 2023-01-04: 100 mg via INTRAVENOUS

## 2023-01-04 MED ORDER — FENTANYL CITRATE (PF) 100 MCG/2ML IJ SOLN
25.0000 ug | INTRAMUSCULAR | Status: DC | PRN
  Administered 2023-01-04: 50 ug via INTRAVENOUS
  Administered 2023-01-04 (×2): 25 ug via INTRAVENOUS

## 2023-01-04 MED ORDER — ROCURONIUM BROMIDE 100 MG/10ML IV SOLN
INTRAVENOUS | Status: DC | PRN
  Administered 2023-01-04: 50 mg via INTRAVENOUS
  Administered 2023-01-04: 20 mg via INTRAVENOUS
  Administered 2023-01-04: 30 mg via INTRAVENOUS

## 2023-01-04 MED ORDER — OXYCODONE HCL 5 MG PO TABS
5.0000 mg | ORAL_TABLET | Freq: Once | ORAL | Status: AC | PRN
Start: 2023-01-04 — End: 2023-01-04
  Administered 2023-01-04: 5 mg via ORAL

## 2023-01-04 MED ORDER — ONDANSETRON HCL 4 MG/2ML IJ SOLN
INTRAMUSCULAR | Status: DC | PRN
  Administered 2023-01-04: 4 mg via INTRAVENOUS

## 2023-01-04 MED ORDER — BUPIVACAINE LIPOSOME 1.3 % IJ SUSP
INTRAMUSCULAR | Status: AC
  Filled 2023-01-04: qty 20

## 2023-01-04 MED ORDER — HYDROMORPHONE HCL 1 MG/ML IJ SOLN
INTRAMUSCULAR | Status: DC | PRN
  Administered 2023-01-04 (×2): .5 mg via INTRAVENOUS

## 2023-01-04 SURGICAL SUPPLY — 54 items
BLADE SURG SZ11 CARB STEEL (BLADE) ×1 IMPLANT
CANNULA CAP OBTURATR AIRSEAL 8 (CAP) IMPLANT
COVER TIP SHEARS 8 DVNC (MISCELLANEOUS) ×1 IMPLANT
COVER WAND RF STERILE (DRAPES) ×1 IMPLANT
DERMABOND ADVANCED .7 DNX12 (GAUZE/BANDAGES/DRESSINGS) ×1 IMPLANT
DRAPE ARM DVNC X/XI (DISPOSABLE) ×3 IMPLANT
DRAPE COLUMN DVNC XI (DISPOSABLE) ×1 IMPLANT
ELECT CAUTERY BLADE TIP 2.5 (TIP) ×1
ELECT REM PT RETURN 9FT ADLT (ELECTROSURGICAL) ×1
ELECTRODE CAUTERY BLDE TIP 2.5 (TIP) ×1 IMPLANT
ELECTRODE REM PT RTRN 9FT ADLT (ELECTROSURGICAL) ×1 IMPLANT
FORCEPS BPLR R/ABLATION 8 DVNC (INSTRUMENTS) ×1 IMPLANT
GLOVE SURG SYN 7.0 (GLOVE) ×2 IMPLANT
GLOVE SURG SYN 7.0 PF PI (GLOVE) ×2 IMPLANT
GLOVE SURG SYN 7.5 E (GLOVE) ×2 IMPLANT
GLOVE SURG SYN 7.5 PF PI (GLOVE) ×2 IMPLANT
GOWN STRL REUS W/ TWL LRG LVL3 (GOWN DISPOSABLE) ×3 IMPLANT
GRASPER SUT TROCAR 14GX15 (MISCELLANEOUS) ×1 IMPLANT
IRRIGATION STRYKERFLOW (MISCELLANEOUS) IMPLANT
IRRIGATOR STRYKERFLOW (MISCELLANEOUS)
IV NS 1000ML BAXH (IV SOLUTION) IMPLANT
KIT PINK PAD W/HEAD ARE REST (MISCELLANEOUS) ×1
KIT PINK PAD W/HEAD ARM REST (MISCELLANEOUS) ×1 IMPLANT
LABEL OR SOLS (LABEL) ×1 IMPLANT
MANIFOLD NEPTUNE II (INSTRUMENTS) ×1 IMPLANT
MESH VENTRALIGHT ST 4.5 ECHO (Mesh General) IMPLANT
NDL DRIVE SUT CUT DVNC (INSTRUMENTS) ×1 IMPLANT
NDL HYPO 22X1.5 SAFETY MO (MISCELLANEOUS) ×1 IMPLANT
NDL INSUFFLATION 14GA 120MM (NEEDLE) ×1 IMPLANT
NEEDLE DRIVE SUT CUT DVNC (INSTRUMENTS) ×1 IMPLANT
NEEDLE HYPO 22X1.5 SAFETY MO (MISCELLANEOUS) ×1 IMPLANT
NEEDLE INSUFFLATION 14GA 120MM (NEEDLE) ×1 IMPLANT
OBTURATOR OPTICAL STND 8 DVNC (TROCAR) ×1
OBTURATOR OPTICALSTD 8 DVNC (TROCAR) ×1 IMPLANT
PACK LAP CHOLECYSTECTOMY (MISCELLANEOUS) ×1 IMPLANT
SCISSORS MNPLR CVD DVNC XI (INSTRUMENTS) ×1 IMPLANT
SEAL UNIV 5-12 XI (MISCELLANEOUS) ×2 IMPLANT
SET TUBE FILTERED XL AIRSEAL (SET/KITS/TRAYS/PACK) IMPLANT
SET TUBE SMOKE EVAC HIGH FLOW (TUBING) ×1 IMPLANT
SOL ELECTROSURG ANTI STICK (MISCELLANEOUS) ×1
SOLUTION ELECTROSURG ANTI STCK (MISCELLANEOUS) ×1 IMPLANT
SPONGE T-LAP 18X18 ~~LOC~~+RFID (SPONGE) ×1 IMPLANT
SUT MNCRL 4-0 27XMFL (SUTURE) ×1
SUT STRATA 2-0 30 CT-2 (SUTURE) ×2 IMPLANT
SUT STRATAFIX PDS 30 CT-1 (SUTURE) ×1 IMPLANT
SUT VIC AB 3-0 SH 27X BRD (SUTURE) IMPLANT
SUT VICRYL 0 UR6 27IN ABS (SUTURE) ×2 IMPLANT
SUTURE MNCRL 4-0 27XMF (SUTURE) ×1 IMPLANT
SYS BAG RETRIEVAL 10MM (BASKET) ×1
SYSTEM BAG RETRIEVAL 10MM (BASKET) IMPLANT
TAPE TRANSPORE STRL 2 31045 (GAUZE/BANDAGES/DRESSINGS) ×1 IMPLANT
TRAP FLUID SMOKE EVACUATOR (MISCELLANEOUS) ×1 IMPLANT
TRAY FOLEY SLVR 16FR LF STAT (SET/KITS/TRAYS/PACK) ×1 IMPLANT
WATER STERILE IRR 500ML POUR (IV SOLUTION) ×1 IMPLANT

## 2023-01-04 NOTE — Anesthesia Postprocedure Evaluation (Signed)
Anesthesia Post Note  Patient: Adrian Moreno  Procedure(s) Performed: XI ROBOT ASSISTED UMBILICAL HERNIA REPAIR  Patient location during evaluation: PACU Anesthesia Type: General Level of consciousness: awake and alert Pain management: pain level controlled Vital Signs Assessment: post-procedure vital signs reviewed and stable Respiratory status: spontaneous breathing, nonlabored ventilation, respiratory function stable and patient connected to nasal cannula oxygen Cardiovascular status: blood pressure returned to baseline and stable Postop Assessment: no apparent nausea or vomiting Anesthetic complications: no   No notable events documented.   Last Vitals:  Vitals:   01/04/23 1430 01/04/23 1502  BP: (!) 143/89 (!) 151/98  Pulse: 80 85  Resp: 17 16  Temp:  (!) 36.1 C  SpO2: 100% 97%    Last Pain:  Vitals:   01/04/23 1502  TempSrc: Temporal  PainSc: 4                  Cleda Mccreedy Brelynn Wheller

## 2023-01-04 NOTE — Anesthesia Procedure Notes (Signed)
Procedure Name: Intubation Date/Time: 01/04/2023 12:15 PM  Performed by: Elisabeth Pigeon, CRNAPre-anesthesia Checklist: Patient identified, Patient being monitored, Timeout performed, Emergency Drugs available and Suction available Patient Re-evaluated:Patient Re-evaluated prior to induction Oxygen Delivery Method: Circle system utilized Preoxygenation: Pre-oxygenation with 100% oxygen Induction Type: IV induction Ventilation: Mask ventilation without difficulty Laryngoscope Size: Mac, McGrath and 4 Grade View: Grade I Tube type: Oral Tube size: 7.5 mm Number of attempts: 1 Airway Equipment and Method: Stylet Placement Confirmation: ETT inserted through vocal cords under direct vision, positive ETCO2 and breath sounds checked- equal and bilateral Secured at: 23 cm Tube secured with: Tape Dental Injury: Teeth and Oropharynx as per pre-operative assessment

## 2023-01-04 NOTE — Op Note (Signed)
  Procedure Date:  01/04/2023  Pre-operative Diagnosis:  Reducible umbilical hernia  Post-operative Diagnosis: Reducible umbilical hernia, 1 cm.  Procedure:  Robotic assisted Umbilical Hernia Repair with mesh  Surgeon:  Howie Ill, MD  Anesthesia:  General endotracheal  Estimated Blood Loss:  10 ml  Specimens:  None  Complications:  None  Indications for Procedure:  This is a 52 y.o. male who presents with an umbilical hernia.  The options of surgery versus observation were reviewed with the patient and/or family. The risks of bleeding, abscess or infection, recurrence of symptoms, potential for an open procedure, injury to surrounding structures, and chronic pain were all discussed with the patient and was willing to proceed.  Description of Procedure: The patient was correctly identified in the preoperative area and brought into the operating room.  The patient was placed supine with VTE prophylaxis in place.  Appropriate time-outs were performed.  Anesthesia was induced and the patient was intubated.  Appropriate antibiotics were infused.  The abdomen was prepped and draped in a sterile fashion. The patient's hernia defect was marked with a marking pen.  A Veress needle was introduced in the left upper quadrant and pneumoperitoneum was obtained with appropriate pressures.  Using Optiview technique, an 8 mm port was introduced in the left lateral abdominal wall without complications.  Then, a 12 mm port was introduced in the left upper quadrant and an 8 mm port in the left lower quadrant under direct visualization.  A 4.5 inch Bard Ventralight ST Echo mesh, a 0 Stratafix suture, and two 2-0 V-loc sutures were inserted through the 12 mm port under direct visualization.  The DaVinci platform was docked, camera targeted, and instruments placed under direct visualization.  The patient's hernia was fully reduced and the peritoneum and preperitoneal fat were dissected and resected to  allow better exposure of the hernia defect and for better mesh placement.  The hernia defect measured 1 cm.  The hernia defect was closed using the stratafix suture.  A PMI was brought through the center of the hernia defect and the positioning system of the mesh was passed through.  This allowed the mesh to splay open and be centered over the repair site with good overlap.  The mesh was then sutured in place circumferentially and through the center of the mesh using the V-loc sutures.  All needles and the positioning system were then removed through the 12 mm port without complications.  The preperitoneal fat was placed in an endocatch bag.  The DaVinci platform was then undocked and instruments removed.    60 ml of Exparel solution mixed with 0.5% bupivacaine with epi was infiltrated around the mesh edges, hernia repair site, and port sites.  The 12 mm port was removed and the endocatch bag retrieved.  The fascia was closed under direct visualization utilizing an Endo Close technique with 0 Vicryl suture.  The 8 mm ports were removed. The 12 mm incision was closed using 3-0 Vicryl and 4-0 Monocryl, and the other port incisions were closed with 4-0 Monocryl.  The wounds were cleaned and sealed with DermaBond.  The patient was emerged from anesthesia and extubated and brought to the recovery room for further management.  The patient tolerated the procedure well and all counts were correct at the end of the case.   Howie Ill, MD

## 2023-01-04 NOTE — Interval H&P Note (Signed)
History and Physical Interval Note:  01/04/2023 11:26 AM  Adrian Moreno  has presented today for surgery, with the diagnosis of reducible umbilical hernia, less 3 cm.  The various methods of treatment have been discussed with the patient and family. After consideration of risks, benefits and other options for treatment, the patient has consented to  Procedure(s): XI ROBOT ASSISTED UMBILICAL HERNIA REPAIR (N/A) as a surgical intervention.  The patient's history has been reviewed, patient examined, no change in status, stable for surgery.  I have reviewed the patient's chart and labs.  Questions were answered to the patient's satisfaction.     [name]

## 2023-01-04 NOTE — Anesthesia Preprocedure Evaluation (Addendum)
Anesthesia Evaluation  Patient identified by MRN, date of birth, ID band Patient awake    Reviewed: Allergy & Precautions, NPO status , Patient's Chart, lab work & pertinent test results  History of Anesthesia Complications Negative for: history of anesthetic complications  Airway Mallampati: III  TM Distance: <3 FB Neck ROM: full    Dental  (+) Chipped   Pulmonary neg shortness of breath, sleep apnea    Pulmonary exam normal        Cardiovascular Exercise Tolerance: Good (-) angina (-) Past MI negative cardio ROS Normal cardiovascular exam     Neuro/Psych  PSYCHIATRIC DISORDERS      negative neurological ROS     GI/Hepatic negative GI ROS, Neg liver ROS,neg GERD  ,,  Endo/Other  negative endocrine ROS    Renal/GU      Musculoskeletal   Abdominal   Peds  Hematology negative hematology ROS (+)   Anesthesia Other Findings Past Medical History: No date: BPH (benign prostatic hypertrophy)     Comment:  Wrenn No date: Cancer [facility])     Comment:  Basal cell on face 04/22/2014: Hypogonadism in male 01/23/2012: OSA (obstructive sleep apnea)     Comment:  did not tolerate CPAP 01/23/2015: Superficial vein thrombosis     Comment:  after achilles surgery  Past Surgical History: 05/23/2014: COLONOSCOPY     Comment:  int hem, rpt 10 yrs Madilyn Fireman) 12/25/2022: COLONOSCOPY WITH PROPOFOL; N/A     Comment:  Procedure: COLONOSCOPY WITH PROPOFOL;  Surgeon: Wyline Mood, MD;  Location: [facility] ENDOSCOPY;  Service:               Gastroenterology;  Laterality: N/A; 01/23/2012: HIP SURGERY; Left     Comment:  torn labrum 01/23/2012: NASAL SEPTUM SURGERY     Comment:  s/p surgery x2 on same day with complications No date: TENOTOMY ACHILLES TENDON; Bilateral  BMI    Body Mass Index: 29.53 kg/m      Reproductive/Obstetrics negative OB ROS                             Anesthesia  Physical Anesthesia Plan  ASA: 3  Anesthesia Plan: General ETT   Post-op Pain Management:    Induction: Intravenous  PONV Risk Score and Plan: Ondansetron, Dexamethasone, Midazolam and Treatment may vary due to age or medical condition  Airway Management Planned: Oral ETT  Additional Equipment:   Intra-op Plan:   Post-operative Plan: Extubation in OR  Informed Consent: I have reviewed the patients History and Physical, chart, labs and discussed the procedure including the risks, benefits and alternatives for the proposed anesthesia with the patient or authorized representative who has indicated his/her understanding and acceptance.     Dental Advisory Given  Plan Discussed with: Anesthesiologist, CRNA and Surgeon  Anesthesia Plan Comments: (Patient consented for risks of anesthesia including but not limited to:  - adverse reactions to medications - damage to eyes, teeth, lips or other oral mucosa - nerve damage due to positioning  - sore throat or hoarseness - Damage to heart, brain, nerves, lungs, other parts of body or loss of life  Patient voiced understanding and assent.)       Anesthesia Quick Evaluation

## 2023-01-04 NOTE — Discharge Instructions (Signed)

## 2023-01-04 NOTE — Transfer of Care (Signed)
Immediate Anesthesia Transfer of Care Note  Patient: Adrian Moreno  Procedure(s) Performed: XI ROBOT ASSISTED UMBILICAL HERNIA REPAIR  Patient Location: PACU  Anesthesia Type:General  Level of Consciousness: awake, alert , and oriented  Airway & Oxygen Therapy: Patient Spontanous Breathing and Patient connected to face mask oxygen  Post-op Assessment: Report given to RN and Post -op Vital signs reviewed and stable  Post vital signs: Reviewed and stable  Last Vitals:  Vitals Value Taken Time  BP 167/87 01/04/23 1418  Temp    Pulse 80 01/04/23 1420  Resp 14 01/04/23 1420  SpO2 100 % 01/04/23 1420  Vitals shown include unfiled device data.  Last Pain:  Vitals:   01/04/23 1115  TempSrc: Temporal  PainSc: 0-No pain      Patients Stated Pain Goal: 0 (01/04/23 1115)  Complications: No notable events documented.

## 2023-01-21 ENCOUNTER — Encounter: Payer: Self-pay | Admitting: Surgery

## 2023-01-21 ENCOUNTER — Ambulatory Visit (INDEPENDENT_AMBULATORY_CARE_PROVIDER_SITE_OTHER): Payer: Commercial Managed Care - PPO | Admitting: Surgery

## 2023-01-21 VITALS — BP 124/81 | HR 67 | Temp 97.7°F | Ht 69.0 in | Wt 201.0 lb

## 2023-01-21 DIAGNOSIS — Z09 Encounter for follow-up examination after completed treatment for conditions other than malignant neoplasm: Secondary | ICD-10-CM | POA: Diagnosis not present

## 2023-01-21 DIAGNOSIS — K429 Umbilical hernia without obstruction or gangrene: Secondary | ICD-10-CM

## 2023-01-21 NOTE — Progress Notes (Signed)
01/21/2023  History of Present Illness: Adrian Moreno is a 52 y.o. male status post robotic assisted umbilical hernia repair on 01/04/2023.  Patient presents today for follow-up.  He reports that he has been doing well and denies any significant pain issues.  Denies any troubles with the incisions.  Still has some sensitivity or soreness at the umbilicus.  Past Medical History: Past Medical History:  Diagnosis Date   BPH (benign prostatic hypertrophy)    Wrenn   Cancer [facility])    Basal cell on face   Hypogonadism in male 04/22/2014   OSA (obstructive sleep apnea) 01/23/2012   did not tolerate CPAP   Superficial vein thrombosis 01/23/2015   after achilles surgery     Past Surgical History: Past Surgical History:  Procedure Laterality Date   COLONOSCOPY  05/23/2014   int hem, rpt 10 yrs Madilyn Fireman)   COLONOSCOPY WITH PROPOFOL N/A 12/25/2022   Procedure: COLONOSCOPY WITH PROPOFOL;  Surgeon: Wyline Mood, MD;  Location: [facility] ENDOSCOPY;  Service: Gastroenterology;  Laterality: N/A;   HIP SURGERY Left 01/23/2012   torn labrum   NASAL SEPTUM SURGERY  01/23/2012   s/p surgery x2 on same day with complications   TENOTOMY ACHILLES TENDON Bilateral     Home Medications: Prior to Admission medications   Medication Sig Start Date End Date Taking? Authorizing Provider  acetaminophen (TYLENOL) 500 MG tablet Take 2 tablets (1,000 mg total) by mouth every 6 (six) hours as needed for mild pain (pain score 1-3). 01/04/23   Henrene Dodge, MD  diphenhydrAMINE (BENADRYL) 25 MG tablet Take 25 mg by mouth every 6 (six) hours as needed for allergies.    [provider]  ibuprofen (ADVIL) 600 MG tablet Take 1 tablet (600 mg total) by mouth every 8 (eight) hours as needed for moderate pain (pain score 4-6). 01/04/23   Liviah Cake, MD  SYRINGE-NEEDLE, DISP, 3 ML (B-D 3CC LUER-LOK SYR 18GX1-1/2) 18G X 1-1/2" 3 ML MISC     [provider]  VITAMIN D, CHOLECALCIFEROL, PO Take 1 capsule by  mouth daily.    [provider]    Allergies: No Known Allergies  Review of Systems: Review of Systems  Constitutional:  Negative for chills and fever.  Respiratory:  Negative for shortness of breath.   Cardiovascular:  Negative for chest pain.  Gastrointestinal:  Positive for abdominal pain. Negative for diarrhea, nausea and vomiting.    Physical Exam BP 124/81   Pulse 67   Temp 97.7 F (36.5 C)   Ht 5\' 9"  (1.753 m)   Wt 201 lb (91.2 kg)   SpO2 97%   BMI 29.68 kg/m  CONSTITUTIONAL: No acute distress, well-nourished HEENT:  Normocephalic, atraumatic, extraocular motion intact. RESPIRATORY:  Normal respiratory effort without pathologic use of accessory muscles. CARDIOVASCULAR: Regular rhythm and rate. GI: The abdomen is soft, nondistended, with some soreness to palpation at the umbilicus itself.  No evidence of hernia recurrence.  Left-sided incisions are healing well and are clean, dry, intact.  NEUROLOGIC:  Motor and sensation is grossly normal.  Cranial nerves are grossly intact. PSYCH:  Alert and oriented to person, place and time. Affect is normal.   Assessment and Plan: This is a 52 y.o. male status post robotic assisted umbilical hernia repair  - Patient is doing well recovering well from surgery.  No complications at this point no evidence of any hernia recurrence.  Incisions are healing well. - Reviewed with him activity restrictions. - Follow-up as needed.  I spent 20  minutes dedicated to the care of this patient on the date of this encounter to include pre-visit review of records, face-to-face time with the patient discussing diagnosis and management, and any post-visit coordination of care.   Howie Ill, MD [facility]

## 2023-01-21 NOTE — Patient Instructions (Signed)

## 2023-01-31 NOTE — Progress Notes (Signed)
 Subjective   Patient ID:  Adrian Moreno is a 53 y.o.(DOB [birth year]) male with a history of the following:  Problem  Low Testosterone   Nocturia    Low T and LUTS  Adrian Moreno presents today with a chief complaint of:     Patient presents with  . Hypotestosteronism    Low T and LUTS  Today he has concerns of: low T and LUTS. This is a new problem. It is felt to be not improved.  Related to this are the following factors: Location: bladder and testis Severity: has had low energy, lethary, fatigue and low libido for last 6-12 months. Has history of low T that he has taken inappropriately in past. Also complains of nocturia up to 5x Current Pain: 0/10 Timing/Duration: 6+ months Modifying Factors: none Associated Signs and Symptoms: listed below  He is referred by himself for low T and fatigue.   He comes in for follow-up on his low T Has been on T on and off in past but symptoms much worse He has up to 5x nocturia but generally 2x Has weak stream Denies pain with urination Has sensation of incomplete emptying  Denies history of stones Denies family history of other GU malignancy Father had prostate cancer  Denies ED but has low libido Denies chest pain or nitro use  He has OSA and had surgery for this Has been unable to    Past Medical History: History reviewed. No pertinent past medical history.  Past Surgical History: History reviewed. No pertinent surgical history.  Medications: Current Outpatient Medications on File Prior to Visit  Medication Sig Dispense Refill  . tamsulosin  (FLOMAX ) 0.4 mg CAPS Take one capsule (0.4 mg dose) by mouth.     No current facility-administered medications on file prior to visit.    Allergies: Patient has no known allergies.  Family History, and Social History were reviewed and updated as appropriate.  Review of Systems:  Positive Symptoms are BOLDED  CONSTITUTIONAL: Fevers, chills, headache, weight  change EYES, NOSE, THROAT: Glaucoma, glasses, sinus problems CARDIOVASCULAR: Chest pain, varicose veins, high blood pressure. RESPIRATORY: Wheezing, COPD, shortness of breath, sleep apnea. ENDOCRINE: Diabetes, thyroid  disease, other.  MUSCULOSKELETAL: Joint pain, neck pain, back pain. GASTROINTESTINAL: Abdominal pain, constipation, diarrhea, nausea, vomiting, indigestion.  HEMATOLOGY: Easy bleeding, easy bruising, aspirin use within the last 2 weeks  NEUROLOGIC: Tremors, dizzy spells, numbness, tingling. GENITOURINARY: Urinary frequency, urinary retention, painful urination, blood in urine  PSYCHOLOGIC: Anxiety, depression, other. All systems reviewed and otherwise negative except as above.    Objective   BMI was reviewed and is elevated. Patient advised to maintain regular exercise to stay within a healthy BMI.  CONSTITUTIONAL: This a 53 y.o. male in no acute distress.   Vitals:   01/31/23 1449  BP: 134/86  Pulse: 78  Temp: 98.4 F (36.9 C)   He is accompanied by nobody today PSYCHOLOGIC:  Awake, alert and oriented to person, place and time. Normal mood and affect DERMATOLOGIC: Skin warm and dry RESPIRATORY: Respiratory effort normal CARDIOVASCULAR:  No cyanosis GASTROINTESTINAL: Abdomen is non-distended  NEUROLOGIC: No gross motor dysfunction. He does not use a walker or wheelchair to assist ambulation LYMPHATIC: No palpable inguinal lymphadenopathy MUSCULOSKELETAL: Range of motion grossly intact GENITOURINARY:   - Penis normal and circumcised  - Urethral meatus normal  - Scrotal exam normal  - Testicles normal bilaterally  - Epididymis normal bilaterally  - Cord structures normal bilaterally  - No evidence of inguinal hernia  -  Anus and perineum normal  - Normal rectal tone with no masses  - DRE: Prostate soft, non-tender to palpation.  No palpable nodules. Size 35 grams.  - Seminal vesicles not palpable.  - Kidneys normal. No CVA tenderness  - Bladder non-tender and  not distended.  ECOG PERFORMANCE STATUS: 0 0 Fully active, able to carry on all pre-disease performance without restriction 1 Restricted in physically strenuous activity but ambulatory and able to carry out work of a light or sedentary nature, e.g., light house work, office work 2 Ambulatory and capable of all selfcare but unable to carry out any work activities; up and about more than 50% of waking hours 3 Capable of only limited selfcare; confined to bed or chair more than 50% of waking hours 4 Completely disabled; cannot carry on any selfcare; totally confined to bed or chair   Labs/Radiology   Office Visit on 01/31/2023  Component Date Value Ref Range Status  . Urine Color 01/31/2023 Yellow   Final  . Urine Clarity 01/31/2023 Clear   Final  . Urine Glucose 01/31/2023 Negative  Negative mg/dL Final  . Urine Bilirubin 01/31/2023 Negative  Negative mg/dL Final  . Urine Ketones 01/31/2023 Negative  Negative mg/dl Final  . Urine Specific Gravity 01/31/2023 1.025  1.005 - 1.030 Final  . Urine pH 01/31/2023 7.0  5.0 - 9.0 Final  . Urine Protein - Dipstick 01/31/2023 Negative  Negative mg/dl Final  . Urine Urobilinogen 01/31/2023 0.2  0.2 , 1.0 mg/dl Final  . Urine Nitrite 01/31/2023 Negative  Negative Final  . Urine Leukocyte Esterase 01/31/2023 Negative  Negative Leu/mcL Final  . Urine Blood 01/31/2023 Negative  Negative mg/dL Final    Notable Labs reviewed:  Lab Results  Component Value Date   Creatinine 0.94 11/25/2022   No results found for: TESTOSTERONE   No results found for: PSA  Radiology:  No results found. Bladder Ultrasound Post Void Residuals: Date Volume (mL)            No results found for this or any previous visit.      Assessment and Plan  Adrian Moreno is a 53 y.o. male with the following diagnoses:  1. Urinary anomaly   2. Low testosterone    3. Nocturia   4. Lower urinary tract symptoms (LUTS)   5. Benign prostatic hyperplasia,  unspecified whether lower urinary tract symptoms present    Problem  Low Testosterone   Nocturia    Our plan is as follows:  Cop[place] was seen today for hypotestosteronism.  Diagnoses and all orders for this visit:  Urinary anomaly -     Urinalysis; Future -     Urinalysis  Low testosterone  -     Testosterone , Free, Total; Future  Nocturia  Lower urinary tract symptoms (LUTS) -     US  Post Void Residual No Images  Benign prostatic hyperplasia, unspecified whether lower urinary tract symptoms present -     PSA; Future -     tadalafil (CIALIS) 5 MG tablet; Take one tablet (5 mg dose) by mouth daily. Take 1 tablet daily for BPH    PSA today Flomax  Check T F/U 4 weeks Sleep study and refer to ENT re: possible Inspire as he has failed CPAP   Orders Placed This Encounter  Procedures  . US  Post Void Residual No Images  . Urinalysis  . Testosterone , Free, Total  . PSA    Risks, benefits, and alternatives of the medications and treatment plan prescribed today were discussed, and  patient expressed understanding.  Patient and or his family expresses understanding and all questions and concerns were answered. The patient is in agreement with the plan as stated above.   Portions of the note were entered using voice recognition software.  Minor syntax, contextual, and spelling errors may be related to the use of this software and were not intentional.  If corrections are necessary, please contact provider.

## 2023-05-27 ENCOUNTER — Ambulatory Visit: Payer: Self-pay

## 2023-05-27 NOTE — Telephone Encounter (Signed)
  Chief Complaint: Chest Pain Symptoms: 3 out of 10 CP that has been going on and off for three months Frequency: three months Pertinent Negatives: Patient denies SOB, fever Disposition: [] ED /[] Urgent Care (no appt availability in office) / [x] Appointment(In office/virtual)/ []  [facility]/ [] Home Care/ [] Refused Recommended Disposition /[] [facility]/ []  Follow-up with PCP Additional Notes: patient was transferred from [facility] office for triage. Patient reports a couple of months of mild chest pain. Patient reports a pain level of 3 out of 10. Patient states that it has been on and off and patient wants to get it checked out. Patient denies SOB or fever. Patient states pain lasts for a couple of minutes at a time. Patient was scheduled for an appointment for tomorrow with PCP prior to transfer to Nurse triage. Patient verbalized understanding of plan and all questions answered.    Reason for Disposition  [1] Chest pain lasts > 5 minutes AND [2] occurred > 3 days ago (72 hours) AND [3] NO chest pain or cardiac symptoms now  Answer Assessment - Initial Assessment Questions 1. LOCATION: "Where does it hurt?"       Middle chest slightly to left 2. RADIATION: "Does the pain go anywhere else?" (e.g., into neck, jaw, arms, back)     no 3. ONSET: "When did the chest pain begin?" (Minutes, hours or days)      Off and on for a couple of months 4. PATTERN: "Does the pain come and go, or has it been constant since it started?"  "Does it get worse with exertion?"      Comes and goes 5. DURATION: "How long does it last" (e.g., seconds, minutes, hours)     A couple of minutes 6. SEVERITY: "How bad is the pain?"  (e.g., Scale 1-10; mild, moderate, or severe)    - MILD (1-3): doesn't interfere with normal activities     - MODERATE (4-7): interferes with normal activities or awakens from sleep    - SEVERE (8-10): excruciating pain, unable to do any normal activities       3 out  of 10 7. CARDIAC RISK FACTORS: "Do you have any history of heart problems or risk factors for heart disease?" (e.g., angina, prior heart attack; diabetes, high blood pressure, high cholesterol, smoker, or strong family history of heart disease)     Family hx of heart problems 8. PULMONARY RISK FACTORS: "Do you have any history of lung disease?"  (e.g., blood clots in lung, asthma, emphysema, birth control pills)     Family hx on mom's side 9. CAUSE: "What do you think is causing the chest pain?"     unsure 10. OTHER SYMPTOMS: "Do you have any other symptoms?" (e.g., dizziness, nausea, vomiting, sweating, fever, difficulty breathing, cough)       no  Protocols used: Chest Pain-A-AH

## 2023-05-27 NOTE — Telephone Encounter (Signed)
 Noted. Will evaluate in office

## 2023-05-28 ENCOUNTER — Ambulatory Visit (INDEPENDENT_AMBULATORY_CARE_PROVIDER_SITE_OTHER)
Admission: RE | Admit: 2023-05-28 | Discharge: 2023-05-28 | Disposition: A | Discharge status: Home / Self Care | Source: Ambulatory Visit | Attending: Nurse Practitioner | Admitting: Nurse Practitioner

## 2023-05-28 ENCOUNTER — Ambulatory Visit: Admitting: Nurse Practitioner

## 2023-05-28 ENCOUNTER — Encounter: Payer: Self-pay | Admitting: Nurse Practitioner

## 2023-05-28 VITALS — BP 116/72 | HR 57 | Temp 97.7°F | Ht 69.0 in | Wt 209.8 lb

## 2023-05-28 DIAGNOSIS — R0602 Shortness of breath: Secondary | ICD-10-CM | POA: Insufficient documentation

## 2023-05-28 DIAGNOSIS — R0789 Other chest pain: Secondary | ICD-10-CM | POA: Diagnosis not present

## 2023-05-28 DIAGNOSIS — Z13220 Encounter for screening for lipoid disorders: Secondary | ICD-10-CM

## 2023-05-28 LAB — LIPID PANEL
Cholesterol: 181 mg/dL (ref 0–200)
HDL: 40.5 mg/dL (ref >39.00)
LDL Cholesterol: 107 mg/dL — ABNORMAL HIGH (ref 0–99)
NonHDL: 140.92
Total CHOL/HDL Ratio: 4
Triglycerides: 172 mg/dL — ABNORMAL HIGH (ref 0.0–149.0)
VLDL: 34.4 mg/dL (ref 0.0–40.0)

## 2023-05-28 LAB — COMPREHENSIVE METABOLIC PANEL WITH GFR
ALT: 21 U/L (ref 0–53)
AST: 16 U/L (ref 0–37)
Albumin: 4.5 g/dL (ref 3.5–5.2)
Alkaline Phosphatase: 55 U/L (ref 39–117)
BUN: 15 mg/dL (ref 6–23)
CO2: 29 meq/L (ref 19–32)
Calcium: 9.5 mg/dL (ref 8.4–10.5)
Chloride: 105 meq/L (ref 96–112)
Creatinine, Ser: 0.84 mg/dL (ref 0.40–1.50)
GFR: 99.28 mL/min (ref >60.00)
Glucose, Bld: 83 mg/dL (ref 70–99)
Potassium: 4.5 meq/L (ref 3.5–5.1)
Sodium: 140 meq/L (ref 135–145)
Total Bilirubin: 0.9 mg/dL (ref 0.2–1.2)
Total Protein: 6.8 g/dL (ref 6.0–8.3)

## 2023-05-28 LAB — CBC
HCT: 49.6 % (ref 39.0–52.0)
Hemoglobin: 16.4 g/dL (ref 13.0–17.0)
MCHC: 33.1 g/dL (ref 30.0–36.0)
MCV: 96.4 fl (ref 78.0–100.0)
Platelets: 173 10*3/uL (ref 150.0–400.0)
RBC: 5.14 Mil/uL (ref 4.22–5.81)
RDW: 12.8 % (ref 11.5–15.5)
WBC: 5.9 10*3/uL (ref 4.0–10.5)

## 2023-05-28 NOTE — Patient Instructions (Addendum)
 Nice to see you today The EKG looks good I will be in touch with the labs and xray once I have them  Follow up 2--3 months

## 2023-05-28 NOTE — Assessment & Plan Note (Signed)
 EKG within normal limits today.  Check CBC CMP and D-dimer along with chest x-ray.  Signs and symptoms reviewed when to seek emergent health care

## 2023-05-28 NOTE — Progress Notes (Signed)
 Acute Office Visit  Subjective:     Patient ID: Adrian Moreno, male    DOB: 18-May-1970, 53 y.o.   MRN: [id number]  Chief Complaint  Patient presents with   Chest Pain    Pt complains of chest pain of and off for several months. States that yesterday he had chest tightness that lasted all day. Pt states today its not as bad. Pt complains of slight sob. Thinks its related to stress.     HPI Patient is in today for Chest pain with a history of OSA, mesenteric panniculitis, hypogonadism, BPH, B12 deficiency, polycythemia, H. pylori, GAD.   States that he noticed it approx 3-4 months ago. States that it has been rare and infrequent. States that over the past few weeks it has been more frequent. States that he has tried tums in the past tha the felt like it helped He mentions that he has been having symptoms for several months that have been intermittent. He did have an episode yesterday that he described as chest tightness that lasted all day. States that he was doing work, low stress. 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 This was after surgery  He thinks that it might be related to stress. He did have a 1 on the scale and then he focused on it and now it is a two  He denies recent surgery, travel fo 4 plus hours without breaks or use of exogenous hormones. He does not know of a family clotting disorder  Review of Systems  Respiratory:  Negative for cough and shortness of breath.   Cardiovascular:  Positive for chest pain. Negative for palpitations.  Gastrointestinal:  Negative for abdominal pain, nausea and vomiting.  Neurological:  Negative for dizziness and headaches.        Objective:    BP 116/72   Pulse (!) 57   Temp 97.7 F (36.5 C) (Oral)   Ht 5\' 9"  (1.753 m)   Wt 209 lb 12.8 oz  (95.2 kg)   SpO2 96%   BMI 30.98 kg/m  BP Readings from Last 3 Encounters:  05/28/23 116/72  01/21/23 124/81  01/04/23 (!) 151/98   Wt Readings from Last 3 Encounters:  05/28/23 209 lb 12.8 oz (95.2 kg)  01/21/23 201 lb (91.2 kg)  01/04/23 200 lb (90.7 kg)   SpO2 Readings from Last 3 Encounters:  05/28/23 96%  01/21/23 97%  01/04/23 97%      Physical Exam Vitals and nursing note reviewed.  Constitutional:      Appearance: Normal appearance.  Cardiovascular:     Rate and Rhythm: Normal rate and regular rhythm.     Heart sounds: Normal heart sounds.  Pulmonary:     Effort: Pulmonary effort is normal.     Breath sounds: Normal breath sounds.  Abdominal:     General: Bowel sounds are normal. There is no distension.     Palpations: There is no mass.     Tenderness: There is no abdominal tenderness.     Hernia: No hernia is present.  Neurological:     Mental Status: He is alert.     No results found for any visits on 05/28/23.      Assessment & Plan:   Problem List Items Addressed This Visit  Other   Atypical chest pain - Primary   Low suspicion for cardiac etiology.  EKG normal limits in office.  Will check blood work along with a chest x-ray.  Patient has been referred to cardiology in the past.  Do not see an Office note.  Patient's last 2 lipids were within normal limits      Relevant Orders   CBC   Comprehensive metabolic panel with GFR   D-dimer, quantitative   EKG 12-Lead (Completed)   DG Chest 2 View (Completed)   Lipid panel   Shortness of breath   EKG within normal limits today.  Check CBC CMP and D-dimer along with chest x-ray.  Signs and symptoms reviewed when to seek emergent health care      Relevant Orders   CBC   Comprehensive metabolic panel with GFR   D-dimer, quantitative   EKG 12-Lead (Completed)   DG Chest 2 View (Completed)   Other Visit Diagnoses       Screening for lipid disorders       Relevant Orders   Lipid panel        No orders of the defined types were placed in this encounter.   Return in about 3 months (around 08/28/2023) for CPE and Labs.  Margarie Shay, NP

## 2023-05-28 NOTE — Assessment & Plan Note (Signed)
 Low suspicion for cardiac etiology.  EKG normal limits in office.  Will check blood work along with a chest x-ray.  Patient has been referred to cardiology in the past.  Do not see an Office note.  Patient's last 2 lipids were within normal limits

## 2023-05-29 ENCOUNTER — Encounter: Payer: Self-pay | Admitting: Nurse Practitioner

## 2023-05-29 LAB — D-DIMER, QUANTITATIVE: D-Dimer, Quant: 0.32 ug{FEU}/mL (ref <0.50)

## 2023-05-30 ENCOUNTER — Telehealth: Payer: Self-pay | Admitting: Nurse Practitioner

## 2023-05-30 DIAGNOSIS — R0602 Shortness of breath: Secondary | ICD-10-CM

## 2023-05-30 NOTE — Telephone Encounter (Signed)
-----   Message from [facility] T sent at 05/29/2023  2:10 PM EDT ----- Called patient reviewed all information and repeated back to me. Will call if any questions.  Patient ok with referral to Cardiology in [place]. Advised if has not received call for appointment in 2 weeks to let us  know.

## 2023-06-06 NOTE — Progress Notes (Unsigned)
 Cardiology Office Note  Date:  06/07/2023   ID:  Adrian Moreno, DOB 11-13-1970, MRN [id number]  PCP:  Dorothe Gaster, NP   Chief Complaint  Patient presents with   New Patient (Initial Visit)   Chest Pain    HPI:  Adrian Moreno is a 53 year old gentleman with past medical history of Sleep apnea Atypical chest pain shortness of breath  who presents by referral from [facility] for consultation of his chest tightness  Reports having symptoms of chest tightness over the past year worse past few months Typically will present at rest not on exertion Sometimes when he is reading or in deep thought will develop chest tightness symptoms "Could be anxiety" Can settle symptoms by relaxing his breathing  "Feel like tightness" Active at baseline, reports cutting down trees, playing basketball last night No chest tightness on exertion  Feels that he is having symptoms in his chest in the exam room today Normal clinical exam and EKG with  No prior echocardiogram available No calcium scoring  CT abdomen images follow-up and reviewed, minimal aortic atherosclerosis noted  EKG personally reviewed by myself on todays visit EKG Interpretation Date/Time:  Friday Jun 07 2023 15:27:34 EDT Ventricular Rate:  69 PR Interval:  188 QRS Duration:  94 QT Interval:  394 QTC Calculation: 422 R Axis:   -23  Text Interpretation: Normal sinus rhythm Normal ECG No previous ECGs available Confirmed by Belva Boyden [phone]) on 06/07/2023 3:43:40 PM    PMH:   has a past medical history of BPH (benign prostatic hypertrophy), Cancer (HCC), Hypogonadism in male (04/22/2014), OSA (obstructive sleep apnea) (01/23/2012), and Superficial vein thrombosis (01/23/2015).  PSH:    Past Surgical History:  Procedure Laterality Date   COLONOSCOPY  05/23/2014   int hem, rpt 10 yrs [facility] Cramp)   COLONOSCOPY WITH PROPOFOL  N/A 12/25/2022   Procedure: COLONOSCOPY WITH PROPOFOL ;  Surgeon: Luke Salaam, MD;   Location[facility] ENDOSCOPY;  Service: Gastroenterology;  Laterality: N/A;   HERNIA REPAIR  2024   HIP SURGERY Left 01/23/2012   torn labrum   NASAL SEPTUM SURGERY  01/23/2012   s/p surgery x2 on same day with complications   TENOTOMY ACHILLES TENDON Bilateral     No current outpatient medications on file.   No current facility-administered medications for this visit.    Allergies:   Patient has no known allergies.   Social History:  The patient  reports that he has never smoked. He has never been exposed to tobacco smoke. He has never used smokeless tobacco. He reports current alcohol use. He reports that he does not use drugs.   Family History:   family history includes Cancer (age of onset: 64) in his father; Diabetes in his father; Heart failure in his father; Hypertension in his father.    Review of Systems: Review of Systems  Constitutional: Negative.   HENT: Negative.    Respiratory: Negative.    Cardiovascular: Negative.        Chest tightness  Gastrointestinal: Negative.   Musculoskeletal: Negative.   Neurological: Negative.   Psychiatric/Behavioral: Negative.    All other systems reviewed and are negative.   PHYSICAL EXAM: VS:  BP 120/81 (BP Location: Left Arm, Patient Position: Sitting, Cuff Size: Normal)   Pulse 67   Resp 16   Ht 5\' 9"  (1.753 m)   Wt 205 lb (93 kg)   SpO2 96%   BMI 30.27 kg/m  , BMI Body mass index is 30.27 kg/m. GEN: Well nourished, well  developed, in no acute distress HEENT: normal Neck: no JVD, carotid bruits, or masses Cardiac: RRR; no murmurs, rubs, or gallops,no edema  Respiratory:  clear to auscultation bilaterally, normal work of breathing GI: soft, nontender, nondistended, + BS MS: no deformity or atrophy Skin: warm and dry, no rash Neuro:  Strength and sensation are intact Psych: euthymic mood, full affect  Recent Labs: 05/28/2023: ALT 21; BUN 15; Creatinine, Ser 0.84; Hemoglobin 16.4; Platelets 173.0; Potassium 4.5; Sodium  140   Lipid Panel Lab Results  Component Value Date   CHOL 181 05/28/2023   HDL 40.50 05/28/2023   LDLCALC 107 (H) 05/28/2023   TRIG 172.0 (H) 05/28/2023      Wt Readings from Last 3 Encounters:  06/07/23 205 lb (93 kg)  05/28/23 209 lb 12.8 oz (95.2 kg)  01/21/23 201 lb (91.2 kg)     ASSESSMENT AND PLAN:  Problem List Items Addressed This Visit     Atypical chest pain   Relevant Orders   EKG 12-Lead (Completed)   Shortness of breath - Primary   OSA (obstructive sleep apnea)   Chest tightness Atypical in nature, presenting at rest not on exertion CT scan abdomen pelvis looking at mid to distal coronary arteries with no significant coronary calcification, minimal aortic atherosclerosis Normal clinical exam and EKG Low risk for angina Concern for inflammation in the chest wall, recommend he try ice packs, NSAIDs Seems to present when he is reading books, and he thought, not when he exerts himself, typically able to relax his breathing and work himself out of his symptoms -If desired calcium scoring could be ordered for further risk stratification.  He will call if interested - In terms of risk factors, non-smoker, no diabetes, reasonable cholesterol on no medication  Aortic atherosclerosis Mild plaquing noted on CT scan, he prefers not to be on cholesterol medication and follow strict diet, lifestyle modification  Mr. Harpenau was seen in consultation for Alvah Auerbach and will be referred back to his office for ongoing care of the issues detailed above  Signed, Juanda Noon, M.D., Ph.D. [facility] [place], [place] [phone]

## 2023-06-07 ENCOUNTER — Ambulatory Visit: Attending: Cardiovascular Disease | Admitting: Cardiovascular Disease

## 2023-06-07 ENCOUNTER — Encounter: Payer: Self-pay | Admitting: Cardiovascular Disease

## 2023-06-07 VITALS — BP 120/81 | HR 67 | Resp 16 | Ht 69.0 in | Wt 205.0 lb

## 2023-06-07 DIAGNOSIS — G4733 Obstructive sleep apnea (adult) (pediatric): Secondary | ICD-10-CM

## 2023-06-07 DIAGNOSIS — R0602 Shortness of breath: Secondary | ICD-10-CM | POA: Diagnosis not present

## 2023-06-07 DIAGNOSIS — R0789 Other chest pain: Secondary | ICD-10-CM

## 2023-06-07 NOTE — Patient Instructions (Signed)
 Medication Instructions:  No changes  Try cold packs and ibuprofen  for chest inflammation  If you need a refill on your cardiac medications before your next appointment, please call your pharmacy.   Lab work: No new labs needed  Testing/Procedures: No new testing needed  Call if you could like a CT coronary calcium score, $99  Follow-Up: At [facility], you and your health needs are our priority.  As part of our continuing mission to provide you with exceptional heart care, we have created designated Provider Care Teams.  These Care Teams include your primary Cardiologist (physician) and Advanced Practice Providers (APPs -  Physician Assistants and Nurse Practitioners) who all work together to provide you with the care you need, when you need it.  You will need a follow up appointment in 12 months  Providers on your designated Care Team:   Laneta Pintos, NP Varney Gentleman, PA-C Cadence Gennaro Khat, [place]  COVID-19 Vaccine Information can be found at: [email or web address] For questions related to vaccine distribution or appointments, please email [email or web address] or call [phone].

## 2023-06-10 ENCOUNTER — Ambulatory Visit: Admitting: Nurse Practitioner

## 2023-06-15 ENCOUNTER — Telehealth: Admitting: Family Medicine

## 2023-06-15 DIAGNOSIS — S41119A Laceration without foreign body of unspecified upper arm, initial encounter: Secondary | ICD-10-CM

## 2023-06-15 NOTE — Progress Notes (Signed)
 E-Visit for Simple Cut/Laceration  We are sorry that you have had an injury. Here is how we plan to help!  Based on what you shared with me it looks like you have a simple laceration that does not need to be repaired with stitches or tissue glue.   HOME CARE: Clean the cut or scrape - Wash it well with soap and water. * avoid using hydrogen peroxide which may cause tissue damage, or impede wound healing.  Stop the bleeding - If your cut or scrape is bleeding, press a clean cloth or bandage firmly on the area for 20 minutes. You can also help slow the bleeding by holding the cut above the level of your heart.   Put a thin layer of Bacitracin antibiotic ointment on the cut or scrape. (this can be purchased at any local pharmacy- ask your pharmacist if you need assistance)   Cover the cut or scrape with a bandage or gauze. Keep the bandage clean and dry. Change the bandage 1 to 2 times every day until your cut or scrape heals.   Watch for signs that your cut or scrape is infected (redness, drainage, pain, warmth, swelling or fever)  Over the next 48 hours your wound should start to improve with less pain, less swelling and less redness. If you should develop increasing pain, swelling, redness, fever, pus from the wound you should be seen immediately to make sure this is not becoming infected.   WOUND CARE: Please keep a layer of antibiotic ointment (bacitracin preferred) on this wound at least twice a day for the next seven days and keep a sterile dressing over top of it. You may gently clean the wound with warm soap and water between dressing changes.  We strongly recommend that you have a medical provider reevaluate your wound within 2 to 3 days in person to make sure that it is healing appropriately.  Thank you for choosing an e-visit.  Your e-visit answers were reviewed by a board certified advanced clinical practitioner to complete your personal care plan. Depending upon the condition,  your plan could have included both over the counter or prescription medications.  Please review your pharmacy choice. Make sure the pharmacy is open so you can pick up prescription now. If there is a problem, you may contact your provider through [employer] and have the prescription routed to another pharmacy.  Your safety is important to us . If you have drug allergies check your prescription carefully.   For the next 24 hours you can use MyChart to ask questions about today's visit, request a non-urgent call back, or ask for a work or school excuse. You will get an email in the next two days asking about your experience. I hope that your e-visit has been valuable and will speed your recovery.    have provided 5 minutes of non face to face time during this encounter for chart review and documentation.

## 2023-06-16 MED ORDER — CEPHALEXIN 500 MG PO CAPS: 500.0000 mg | ORAL_CAPSULE | Freq: Four times a day (QID) | ORAL | 0 refills | Status: AC

## 2023-06-16 NOTE — Addendum Note (Signed)
 Addended by: Lochlin Eppinger M on: 06/16/2023 08:18 PM   Modules accepted: Orders

## 2023-08-08 ENCOUNTER — Encounter: Payer: Self-pay | Admitting: Nurse Practitioner

## 2023-08-08 MED ORDER — TAMSULOSIN HCL 0.4 MG PO CAPS: 0.4000 mg | ORAL_CAPSULE | Freq: Every day | ORAL | 1 refills | Status: DC

## 2023-08-29 ENCOUNTER — Encounter: Payer: Self-pay | Admitting: Nurse Practitioner

## 2023-08-29 ENCOUNTER — Ambulatory Visit (INDEPENDENT_AMBULATORY_CARE_PROVIDER_SITE_OTHER): Admitting: Nurse Practitioner

## 2023-08-29 VITALS — BP 124/80 | HR 63 | Temp 98.0°F | Ht 68.5 in | Wt 208.6 lb

## 2023-08-29 DIAGNOSIS — R351 Nocturia: Secondary | ICD-10-CM | POA: Diagnosis not present

## 2023-08-29 DIAGNOSIS — R7989 Other specified abnormal findings of blood chemistry: Secondary | ICD-10-CM | POA: Diagnosis not present

## 2023-08-29 DIAGNOSIS — Z125 Encounter for screening for malignant neoplasm of prostate: Secondary | ICD-10-CM

## 2023-08-29 DIAGNOSIS — E669 Obesity, unspecified: Secondary | ICD-10-CM | POA: Diagnosis not present

## 2023-08-29 DIAGNOSIS — E291 Testicular hypofunction: Secondary | ICD-10-CM | POA: Diagnosis not present

## 2023-08-29 DIAGNOSIS — Z131 Encounter for screening for diabetes mellitus: Secondary | ICD-10-CM | POA: Diagnosis not present

## 2023-08-29 DIAGNOSIS — E7800 Pure hypercholesterolemia, unspecified: Secondary | ICD-10-CM | POA: Diagnosis not present

## 2023-08-29 DIAGNOSIS — N401 Enlarged prostate with lower urinary tract symptoms: Secondary | ICD-10-CM | POA: Diagnosis not present

## 2023-08-29 DIAGNOSIS — G4733 Obstructive sleep apnea (adult) (pediatric): Secondary | ICD-10-CM

## 2023-08-29 DIAGNOSIS — Z0000 Encounter for general adult medical examination without abnormal findings: Secondary | ICD-10-CM | POA: Diagnosis not present

## 2023-08-29 LAB — CBC WITH DIFFERENTIAL/PLATELET
Basophils Absolute: 0.1 K/uL (ref 0.0–0.1)
Basophils Relative: 1.1 % (ref 0.0–3.0)
Eosinophils Absolute: 0.2 K/uL (ref 0.0–0.7)
Eosinophils Relative: 4.4 % (ref 0.0–5.0)
HCT: 48.7 % (ref 39.0–52.0)
Hemoglobin: 16.4 g/dL (ref 13.0–17.0)
Lymphocytes Relative: 31.8 % (ref 12.0–46.0)
Lymphs Abs: 1.6 K/uL (ref 0.7–4.0)
MCHC: 33.6 g/dL (ref 30.0–36.0)
MCV: 93.6 fl (ref 78.0–100.0)
Monocytes Absolute: 0.4 K/uL (ref 0.1–1.0)
Monocytes Relative: 8.8 % (ref 3.0–12.0)
Neutro Abs: 2.8 K/uL (ref 1.4–7.7)
Neutrophils Relative %: 53.9 % (ref 43.0–77.0)
Platelets: 147 K/uL — ABNORMAL LOW (ref 150.0–400.0)
RBC: 5.21 Mil/uL (ref 4.22–5.81)
RDW: 12.9 % (ref 11.5–15.5)
WBC: 5.1 K/uL (ref 4.0–10.5)

## 2023-08-29 LAB — COMPREHENSIVE METABOLIC PANEL WITH GFR
ALT: 21 U/L (ref 0–53)
AST: 15 U/L (ref 0–37)
Albumin: 4.3 g/dL (ref 3.5–5.2)
Alkaline Phosphatase: 57 U/L (ref 39–117)
BUN: 12 mg/dL (ref 6–23)
CO2: 25 meq/L (ref 19–32)
Calcium: 9.4 mg/dL (ref 8.4–10.5)
Chloride: 105 meq/L (ref 96–112)
Creatinine, Ser: 0.83 mg/dL (ref 0.40–1.50)
GFR: 99.46 mL/min (ref >60.00)
Glucose, Bld: 99 mg/dL (ref 70–99)
Potassium: 4.6 meq/L (ref 3.5–5.1)
Sodium: 141 meq/L (ref 135–145)
Total Bilirubin: 0.8 mg/dL (ref 0.2–1.2)
Total Protein: 6.6 g/dL (ref 6.0–8.3)

## 2023-08-29 LAB — VITAMIN B12: Vitamin B-12: 245 pg/mL (ref 211–911)

## 2023-08-29 LAB — TSH: TSH: 1.04 u[IU]/mL (ref 0.35–5.50)

## 2023-08-29 LAB — LIPID PANEL
Cholesterol: 176 mg/dL (ref 0–200)
HDL: 42.3 mg/dL (ref >39.00)
LDL Cholesterol: 97 mg/dL (ref 0–99)
NonHDL: 133.35
Total CHOL/HDL Ratio: 4
Triglycerides: 180 mg/dL — ABNORMAL HIGH (ref 0.0–149.0)
VLDL: 36 mg/dL (ref 0.0–40.0)

## 2023-08-29 LAB — VITAMIN D 25 HYDROXY (VIT D DEFICIENCY, FRACTURES): VITD: 24.78 ng/mL — ABNORMAL LOW (ref 30.00–100.00)

## 2023-08-29 LAB — PSA: PSA: 0.5 ng/mL (ref 0.10–4.00)

## 2023-08-29 LAB — TESTOSTERONE: Testosterone: 275.81 ng/dL — ABNORMAL LOW (ref 300.00–890.00)

## 2023-08-29 LAB — HEMOGLOBIN A1C: Hgb A1c MFr Bld: 5.9 % (ref 4.6–6.5)

## 2023-08-29 MED ORDER — TAMSULOSIN HCL 0.4 MG PO CAPS: 0.8000 mg | ORAL_CAPSULE | Freq: Every day | ORAL | 3 refills | Status: DC

## 2023-08-29 NOTE — Assessment & Plan Note (Signed)
 History of the same.  Patient would like testosterone  rechecked he does have a history of low testosterone  with replacement in the past.  We had a detailed discussion that this does increase his risk for prostate cancer if he pursues replacement

## 2023-08-29 NOTE — Assessment & Plan Note (Signed)
 History of the same.  Pending lipid panel today.  Patient has made some dietary changes

## 2023-08-29 NOTE — Assessment & Plan Note (Signed)
 History of the same in setting fatigue pending B12 level

## 2023-08-29 NOTE — Assessment & Plan Note (Signed)
 History of same patient currently maintained on tamsulosin  0.8 mg daily with good improvement in symptoms.  Continue taking medication as prescribed.  Pending PSA today.  Refill provided

## 2023-08-29 NOTE — Assessment & Plan Note (Signed)
 History of same currently not maintained on any interventional therapy.  Per patient report he was retested and did not need CPAP

## 2023-08-29 NOTE — Patient Instructions (Signed)
 Nice to see you today I will be in touch with the labs once I have them Follow up with me in 1 year, sooner if you need me

## 2023-08-29 NOTE — Assessment & Plan Note (Signed)
 Discussed age-appropriate immunizations and screening exams.  Did review patient's personal, surgical, social, family histories.  Patient is up-to-date on all age-appropriate vaccinations he would like.  Patient declined shingles vaccine today.  Patient up-to-date on CRC screening.  PSA for prostate cancer screening today.  Patient was given information at discharge about preventative healthcare maintenance with anticipatory guidance.

## 2023-08-29 NOTE — Assessment & Plan Note (Signed)
 History of the same in setting of fatigue pending B12 level

## 2023-08-29 NOTE — Progress Notes (Signed)
 Established Patient Office Visit  Subjective   Patient ID: Adrian Moreno, male    DOB: 03/24/1970  Age: 53 y.o. MRN: [id number]  Chief Complaint  Patient presents with   Annual Exam    Declines screenings and vaccines    HPI  OSA: History of the same cannot tolerate CPAP.  Patient states he has been tested times after and does not require any intervention.  BPH: Patient currently maintained on tamsulosin  0.4 mg nightly. States that it is not as effective. He is taking 0.8mg  daily he is splitting dose. States that he does get u once with 2 tablets.   for complete physical and follow up of chronic conditions.  Immunizations: -Tetanus: Completed in within 10 years -Influenza: Out of season -Shingles: Refused -Pneumonia: Too young  Diet: Fair diet. He is eating 3 meals a day and he does snack but cut down on sugar. Water and 1 coffee in the am  Exercise: No regular exercise.  Eye exam: Glasses. Yearly  Dental exam: needs updating     Colonoscopy: Completed in 12/25/2022, repeat 10 years patient will be due in 2030 for Lung Cancer Screening: N/A  PSA: Due, gets DRE through urologist yearly  Sleep: going to bed around 10 and get up at 6. Does not feel rested. States that he does not snore.        Review of Systems  Constitutional:  Negative for chills and fever.  Respiratory:  Negative for shortness of breath.   Cardiovascular:  Negative for chest pain and leg swelling.  Gastrointestinal:  Negative for abdominal pain, blood in stool, constipation, diarrhea, nausea and vomiting.       Bm daily   Genitourinary:  Negative for dysuria and hematuria.  Neurological:  Negative for dizziness, tingling and headaches.  Psychiatric/Behavioral:  Negative for hallucinations and suicidal ideas.       Objective:     BP 124/80   Pulse 63   Temp 98 F (36.7 C) (Oral)   Ht 5' 8.5 (1.74 m)   Wt 208 lb 9.6 oz (94.6 kg)   SpO2 95%   BMI 31.26 kg/m  BP Readings from Last 3  Encounters:  08/29/23 124/80  06/07/23 120/81  05/28/23 116/72   Wt Readings from Last 3 Encounters:  08/29/23 208 lb 9.6 oz (94.6 kg)  06/07/23 205 lb (93 kg)  05/28/23 209 lb 12.8 oz (95.2 kg)   SpO2 Readings from Last 3 Encounters:  08/29/23 95%  06/07/23 96%  05/28/23 96%      Physical Exam Vitals and nursing note reviewed.  Constitutional:      Appearance: Normal appearance.  HENT:     Right Ear: Tympanic membrane, ear canal and external ear normal.     Left Ear: Tympanic membrane, ear canal and external ear normal.     Mouth/Throat:     Mouth: Mucous membranes are moist.     Pharynx: Oropharynx is clear.  Eyes:     Extraocular Movements: Extraocular movements intact.     Pupils: Pupils are equal, round, and reactive to light.  Cardiovascular:     Rate and Rhythm: Normal rate and regular rhythm.     Pulses: Normal pulses.     Heart sounds: Normal heart sounds.  Pulmonary:     Effort: Pulmonary effort is normal.     Breath sounds: Normal breath sounds.  Abdominal:     General: Bowel sounds are normal. There is no distension.     Palpations: There is  no mass.     Tenderness: There is no abdominal tenderness.     Hernia: No hernia is present.  Musculoskeletal:     Right lower leg: No edema.     Left lower leg: No edema.  Lymphadenopathy:     Cervical: No cervical adenopathy.  Skin:    General: Skin is warm.  Neurological:     General: No focal deficit present.     Mental Status: He is alert.     Deep Tendon Reflexes:     Reflex Scores:      Bicep reflexes are 2+ on the right side and 2+ on the left side.      Patellar reflexes are 2+ on the right side and 2+ on the left side.    Comments: Bilateral upper and lower extremity strength 5/5  Psychiatric:        Mood and Affect: Mood normal.        Behavior: Behavior normal.        Thought Content: Thought content normal.        Judgment: Judgment normal.      No results found for any visits on  08/29/23.    The 10-year ASCVD risk score (Arnett DK, et al., 2019) is: 5.2%    Assessment & Plan:   Problem List Items Addressed This Visit       Respiratory   OSA (obstructive sleep apnea)   History of same currently not maintained on any interventional therapy.  Per patient report he was retested and did not need CPAP        Endocrine   Hypogonadism in male   History of the same.  Patient would like testosterone  rechecked he does have a history of low testosterone  with replacement in the past.  We had a detailed discussion that this does increase his risk for prostate cancer if he pursues replacement      Relevant Orders   Testosterone      Genitourinary   Benign prostatic hyperplasia   History of same patient currently maintained on tamsulosin  0.8 mg daily with good improvement in symptoms.  Continue taking medication as prescribed.  Pending PSA today.  Refill provided      Relevant Medications   tamsulosin  (FLOMAX ) 0.4 MG CAPS capsule   Other Relevant Orders   PSA     Other   Low vitamin B12 level   History of the same in setting of fatigue pending B12 level      Relevant Orders   Vitamin B12   Preventative health care - Primary   Discussed age-appropriate immunizations and screening exams.  Did review patient's personal, surgical, social, family histories.  Patient is up-to-date on all age-appropriate vaccinations he would like.  Patient declined shingles vaccine today.  Patient up-to-date on CRC screening.  PSA for prostate cancer screening today.  Patient was given information at discharge about preventative healthcare maintenance with anticipatory guidance.      Relevant Orders   Comprehensive metabolic panel with GFR   CBC with Differential/Platelet   TSH   Low serum vitamin D    History of the same in setting fatigue pending B12 level      Relevant Orders   VITAMIN D  25 Hydroxy (Vit-D Deficiency, Fractures)   Elevated LDL cholesterol level   History  of the same.  Pending lipid panel today.  Patient has made some dietary changes      Relevant Orders   Hemoglobin A1c   Lipid panel  Other Visit Diagnoses       Obesity (BMI 30-39.9)       Relevant Orders   Hemoglobin A1c   Lipid panel     Screening for diabetes mellitus       Relevant Orders   Hemoglobin A1c     Screening for prostate cancer       Relevant Orders   PSA       Return in about 1 year (around 08/28/2024) for CPE and Labs.    Adina Crandall, NP

## 2023-09-02 ENCOUNTER — Ambulatory Visit: Payer: Self-pay | Admitting: Nurse Practitioner

## 2023-09-02 DIAGNOSIS — R7303 Prediabetes: Secondary | ICD-10-CM

## 2023-09-18 ENCOUNTER — Telehealth: Admitting: Nurse Practitioner

## 2023-09-18 DIAGNOSIS — B9789 Other viral agents as the cause of diseases classified elsewhere: Secondary | ICD-10-CM

## 2023-09-18 DIAGNOSIS — J329 Chronic sinusitis, unspecified: Secondary | ICD-10-CM

## 2023-09-18 MED ORDER — IPRATROPIUM BROMIDE 0.03 % NA SOLN: 2.0000 | Freq: Two times a day (BID) | NASAL | 12 refills | Status: DC

## 2023-09-18 NOTE — Progress Notes (Signed)
 E-Visit for Sinus Problems  We are sorry that you are not feeling well.  Here is how we plan to help!  Based on what you have shared with me it looks like you have sinusitis.  Sinusitis is inflammation and infection in the sinus cavities of the head.  Based on your presentation I believe you most likely have Acute Viral Sinusitis.This is an infection most likely caused by a virus. There is not specific treatment for viral sinusitis other than to help you with the symptoms until the infection runs its course.  You may use an oral decongestant such as Mucinex D or if you have glaucoma or high blood pressure use plain Mucinex. Saline nasal spray help and can safely be used as often as needed for congestion, I have prescribed: Ipratropium Bromide  nasal spray 0.03% 2 sprays in eah nostril 2-3 times a day  Some authorities believe that zinc sprays or the use of Echinacea may shorten the course of your symptoms.  We do not recommend antibiotics prior to 7-10 days of symptoms. Providers prescribe antibiotics to treat infections caused by bacteria. Antibiotics are very powerful in treating bacterial infections when they are used properly. To maintain their effectiveness, they should be used only when necessary. Overuse of antibiotics has resulted in the development of superbugs that are resistant to treatment!    After careful review of your answers, I would not recommend an antibiotic for your condition.  Antibiotics are not effective against viruses and therefore should not be used to treat them. Common examples of infections caused by viruses include colds and flu  Sinus infections are not as easily transmitted as other respiratory infection, however we still recommend that you avoid close contact with loved ones, especially the very young and elderly.  Remember to wash your hands thoroughly throughout the day as this is the number one way to prevent the spread of infection!  Home Care: Only take medications  as instructed by your medical team. Do not take these medications with alcohol. A steam or ultrasonic humidifier can help congestion.  You can place a towel over your head and breathe in the steam from hot water coming from a faucet. Avoid close contacts especially the very young and the elderly. Cover your mouth when you cough or sneeze. Always remember to wash your hands.  Get Help Right Away If: You develop worsening fever or sinus pain. You develop a severe head ache or visual changes. Your symptoms persist after you have completed your treatment plan.  Make sure you Understand these instructions. Will watch your condition. Will get help right away if you are not doing well or get worse.   Thank you for choosing an e-visit.  Your e-visit answers were reviewed by a board certified advanced clinical practitioner to complete your personal care plan. Depending upon the condition, your plan could have included both over the counter or prescription medications.  Please review your pharmacy choice. Make sure the pharmacy is open so you can pick up prescription now. If there is a problem, you may contact your provider through [employer] and have the prescription routed to another pharmacy.  Your safety is important to us . If you have drug allergies check your prescription carefully.   For the next 24 hours you can use MyChart to ask questions about today's visit, request a non-urgent call back, or ask for a work or school excuse. You will get an email in the next two days asking about your experience.  I hope that your e-visit has been valuable and will speed your recovery.  I spent approximately 5 minutes reviewing the patient's history, current symptoms and coordinating their care today.

## 2023-09-26 ENCOUNTER — Encounter: Payer: Self-pay | Admitting: General Practice

## 2023-09-26 ENCOUNTER — Ambulatory Visit: Admitting: General Practice

## 2023-09-26 ENCOUNTER — Ambulatory Visit: Payer: Self-pay | Admitting: General Practice

## 2023-09-26 VITALS — BP 126/70 | HR 67 | Temp 98.0°F | Ht 68.5 in | Wt 209.8 lb

## 2023-09-26 DIAGNOSIS — J209 Acute bronchitis, unspecified: Secondary | ICD-10-CM

## 2023-09-26 DIAGNOSIS — J0140 Acute pansinusitis, unspecified: Secondary | ICD-10-CM | POA: Diagnosis not present

## 2023-09-26 DIAGNOSIS — R051 Acute cough: Secondary | ICD-10-CM | POA: Diagnosis not present

## 2023-09-26 LAB — POC INFLUENZA A&B (BINAX/QUICKVUE)
Influenza A, POC: NEGATIVE
Influenza B, POC: NEGATIVE

## 2023-09-26 LAB — POC COVID19 BINAXNOW: SARS Coronavirus 2 Ag: NEGATIVE

## 2023-09-26 MED ORDER — PREDNISONE 20 MG PO TABS: 40.0000 mg | ORAL_TABLET | Freq: Every day | ORAL | 0 refills | Status: AC

## 2023-09-26 MED ORDER — DOXYCYCLINE HYCLATE 100 MG PO TABS: 100.0000 mg | ORAL_TABLET | Freq: Two times a day (BID) | ORAL | 0 refills | Status: AC

## 2023-09-26 MED ORDER — BENZONATATE 200 MG PO CAPS: 200.0000 mg | ORAL_CAPSULE | Freq: Three times a day (TID) | ORAL | 0 refills | Status: DC | PRN

## 2023-09-26 NOTE — Telephone Encounter (Signed)
 Spoke with pt scheduling OV today at 11:20 with [facility] for persistent cough.

## 2023-09-26 NOTE — Patient Instructions (Addendum)
 You can try a few things over the counter to help with your symptoms including:  Cough: Delsym or Robitussin (get the off brand, works just as well) Chest Congestion: Mucinex (plain) Nasal Congestion/Ear Pressure/Sinus Pressure: Try using Flonase (fluticasone) nasal spray. Instill 1 spray in each nostril twice daily. This can be purchased over the counter. Body aches, fevers, headache: Ibuprofen  (not to exceed 2400 mg in 24 hours) or Acetaminophen -Tylenol  (not to exceed 3000 mg in 24 hours) Runny Nose/Throat Drainage/Sneezing/Itchy or Watery Eyes: An antihistamine such as Zyrtec, Claritin, Xyzal, Allegra  Start Benzonatate  capsules for cough. Take 1 capsule by mouth three times daily as needed for cough.  Start prednisone  20 mg tablets. Take 2 tablets by mouth once daily in the morning for 5 days.  Rest, increase fluid intake and use a cool-mist humidifier at bedtime.   It was a pleasure meeting you!

## 2023-09-26 NOTE — Progress Notes (Signed)
 Established Patient Office Visit  Subjective   Patient ID: Adrian Moreno, male    DOB: April 21, 1970  Age: 53 y.o. MRN: [id number]  Chief Complaint  Patient presents with   Cough    Productive cough, rattle in chest when coughs, headaches, left eye dryness X 10 days. Patient has been nasal spray and taking dayquil and nyquil for sx.     Cough Associated symptoms include headaches, a sore throat and wheezing. Pertinent negatives include no ear pain.   Adrian Moreno is a 53 year old male, patient of Adina Crandall, NP, presents today for an acute visit.   Discussed the use of AI scribe software for clinical note transcription with the patient, who gave verbal consent to proceed.  History of Present Illness Adrian Moreno is a 53 year old male who presents with persistent cough and sinus symptoms.  His symptoms began approximately ten days ago with a sore throat that resolved within a day, followed by sinus congestion and headaches, which are atypical for him.  Over the past five to six days, he has experienced a persistent cough with a rattling sensation in his chest, exacerbated by deep breaths, laughter, or talking quickly, leading to coughing fits and intense headaches. He describes a foul taste when coughing and notes that his wife has observed wheezing at night. He reports coughing up clear, mucous-like sputum and is unsure if it is due to post-nasal drainage or originating from his chest.  He experiences significant nasal congestion and sinus pain, but no ear pain. His eyes feel dry, requiring the use of eye drops, and he has noticed cracks in the corners of his eyes, possibly related to nasal symptoms. He denies blurred vision but reports dryness and pressure in his eyes.  He has not measured his temperature but suspects he may have had fevers due to experiencing cold sweats. He has been using Mucinex, which he believes helps with drainage and chest congestion. The cough does not  keep him up at night.  He denies any chest pain, difficulty breathing, nausea or vomiting.   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   Situational anxiety 07/22/2015   Low serum vitamin D  07/06/2015   Preventative health care 07/08/2014   Low vitamin B12 level 04/25/2014   Atypical chest pain 04/22/2014   OSA (obstructive  sleep apnea) 04/22/2014   Nasal septal deviation 04/22/2014   Hypogonadism in male 04/22/2014   Overweight 04/22/2014   Benign prostatic hyperplasia 04/22/2014   Memory loss 04/22/2014   Past Medical History:  Diagnosis Date   BPH (benign prostatic hypertrophy)    Wrenn   Cancer [facility])    Basal cell on  face   Hypogonadism in male 04/22/2014   OSA (obstructive sleep apnea) 01/23/2012   did not tolerate CPAP   Superficial vein thrombosis 01/23/2015   after achilles surgery   Past Surgical History:  Procedure Laterality Date   COLONOSCOPY  05/23/2014   int hem, rpt 10 yrs Kristen)   COLONOSCOPY WITH PROPOFOL  N/A 12/25/2022   Procedure: COLONOSCOPY WITH PROPOFOL ;  Surgeon: Therisa Bi, MD;  Location: [facility] ENDOSCOPY;  Service: Gastroenterology;  Laterality: N/A;   HERNIA REPAIR  2024   HIP SURGERY Left 01/23/2012   torn labrum   NASAL SEPTUM SURGERY  01/23/2012   s/p surgery x2 on same day with complications   TENOTOMY ACHILLES TENDON Bilateral    No Known Allergies       09/26/2023   11:40 AM 08/29/2023    8:11 AM 07/24/2022    8:45 AM  Depression screen PHQ 2/9  Decreased Interest 0 1 0  Down, Depressed, Hopeless 0 0 0  PHQ - 2 Score 0 1 0  Altered sleeping 0 1 0  Tired, decreased energy 0 3 0  Change in appetite 0 0 0  Feeling bad or failure about yourself  0 0 0  Trouble concentrating 0 0 0  Moving slowly or fidgety/restless 0 0 0  Suicidal thoughts 0 0 0  PHQ-9 Score 0 5 0  Difficult doing work/chores Not difficult at all Somewhat difficult        09/26/2023   11:40 AM 08/29/2023    8:11 AM 07/16/2022    9:33 AM 04/05/2022    1:44 PM  GAD 7 : Generalized Anxiety Score  Nervous, Anxious, on Edge 0 0 0 1  Control/stop worrying 0 0 0 1  Worry too much - different things 0 0 0 0  Trouble relaxing 0 0 0 0  Restless 0 0 0 1  Easily annoyed or irritable 0 0 0 1  Afraid - awful might happen 0 0 0 0  Total GAD 7 Score 0 0 0 4  Anxiety Difficulty Not difficult at all Not difficult at all Not difficult at all Not difficult at all      Review of Systems  HENT:  Positive for congestion, sinus pain and sore throat. Negative for ear pain.   Eyes:  Positive for discharge. Negative for blurred vision.  Respiratory:  Positive for cough, sputum production and wheezing.    Neurological:  Positive for headaches.      Objective:     BP 126/70   Pulse 67   Temp 98 F (36.7 C) (Oral)   Ht 5' 8.5 (1.74 m)   Wt 209 lb 12.8 oz (95.2 kg)   SpO2 97%   BMI 31.44 kg/m  BP Readings from Last 3 Encounters:  09/26/23 126/70  08/29/23 124/80  06/07/23 120/81   Wt Readings from Last 3 Encounters:  09/26/23 209 lb 12.8 oz (95.2 kg)  08/29/23 208 lb 9.6 oz (94.6 kg)  06/07/23 205 lb (93 kg)      Physical Exam Vitals and nursing note reviewed.  Constitutional:      Appearance: Normal appearance.  Cardiovascular:  Rate and Rhythm: Normal rate and regular rhythm.     Pulses: Normal pulses.     Heart sounds: Normal heart sounds.  Pulmonary:     Effort: Pulmonary effort is normal.     Breath sounds: Normal breath sounds.  Skin:    General: Skin is warm.  Neurological:     Mental Status: He is alert and oriented to person, place, and time.  Psychiatric:        Mood and Affect: Mood normal.        Behavior: Behavior normal.        Thought Content: Thought content normal.        Judgment: Judgment normal.      Results for orders placed or performed in visit on 09/26/23  POC COVID-19  Result Value Ref Range   SARS Coronavirus 2 Ag Negative Negative  POC Influenza A&B (Binax test)  Result Value Ref Range   Influenza A, POC Negative Negative   Influenza B, POC Negative Negative       The 10-year ASCVD risk score (Arnett DK, et al., 2019) is: 5%    Assessment & Plan:  Acute pansinusitis, recurrence not specified -     Doxycycline  Hyclate; Take 1 tablet (100 mg total) by mouth 2 (two) times daily for 7 days.  Dispense: 14 tablet; Refill: 0  Acute cough -     POC COVID-19 BinaxNow -     POC Influenza A&B(BINAX/QUICKVUE) -     Benzonatate ; Take 1 capsule (200 mg total) by mouth 3 (three) times daily as needed.  Dispense: 20 capsule; Refill: 0 -     predniSONE ; Take 2 tablets (40 mg total) by mouth daily for 5 days.  Dispense: 10 tablet;  Refill: 0  Acute bronchitis, unspecified organism    Assessment and Plan Assessment & Plan Acute sinusitis with associated cough Symptoms suggest acute sinusitis  - Negative COVID-19 and flu tests. - Start Doxycycline  antibiotic for the infection. Take 1 tablet by mouth twice daily for 10 days. - Prescribed Tessalon  Perles for cough, up to three times daily. - Recommended continued use of Mucinex for chest congestion. - Advised use of cool mist humidifier during sleep. - Prescribed 5-day course of prednison.  - Instructed to increase fluid intake and rest. - Advised follow-up if no improvement in a week.   Return if symptoms worsen or fail to improve.    Carrol Aurora, NP

## 2023-10-11 ENCOUNTER — Encounter: Payer: Self-pay | Admitting: Nurse Practitioner

## 2023-10-11 DIAGNOSIS — R7989 Other specified abnormal findings of blood chemistry: Secondary | ICD-10-CM

## 2023-10-15 NOTE — Addendum Note (Signed)
 Addended by: WENDEE LYNWOOD HERO on: 10/15/2023 12:53 PM   Modules accepted: Orders

## 2023-11-15 NOTE — Progress Notes (Signed)
 11/22/23 8:40 AM   Adrian Moreno [birth year] [id number]   HPI: 53 y.o. male here for initial evaluation of low testosterone    Patient diagnosed with low T in his 30s.  Treated off-and-on for several years with IM injections Stopped TRT over a year ago Reports marginal symptomatic improvement while on TRT  Duration of symptoms: > 1 year and intermittent  Libido: reduced Daytime fatigue: severe, requiring naps Muscle mass: preserved Sleep pattern: moderate insomnia and poor sleep quality Mood: depressed mood Erectile function: normal, baseline  Partner/Spouse: married, good relationship  Prior testosterone  use: IM injections Prior anabolic steroids, opioids, OTC supplements: negative  Relevant medical history:    - Previously seen by [facility] in Jan 2025- low T / LUTS   - on 5mg  daily Cialis for BPH/ED   - Hx of prior vasectomy (5+ years ago), 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   - No personal history of prostate Ca    - No family hx of GU malignancies     PMH: Past Medical History:  Diagnosis Date   BPH (benign prostatic hypertrophy)    Wrenn   Cancer [facility])    Basal cell on face   Hypogonadism in male 04/22/2014   OSA (obstructive sleep apnea) 01/23/2012   did not tolerate CPAP   Superficial vein thrombosis 01/23/2015   after achilles surgery    Surgical History: Past Surgical History:  Procedure Laterality Date   COLONOSCOPY  05/23/2014   int hem, rpt 10 yrs Kristen)   COLONOSCOPY WITH PROPOFOL  N/A 12/25/2022   Procedure: COLONOSCOPY WITH PROPOFOL ;  Surgeon: Therisa Bi, MD;  Location: [facility] ENDOSCOPY;  Service: Gastroenterology;  Laterality: N/A;   HERNIA REPAIR  2024   HIP SURGERY Left 01/23/2012   torn labrum   NASAL SEPTUM SURGERY  01/23/2012   s/p surgery x2 on same day with complications   TENOTOMY ACHILLES TENDON Bilateral     Family History: Family History  Problem Relation Age of Onset   Cancer  Father 62       prostate (normal PSAs) deceased at 48   Hypertension Father    Diabetes Father    Heart failure Father    CAD Neg Hx    Stroke Neg Hx     Social History:  reports that he has never smoked. He has never been exposed to tobacco smoke. He has never used smokeless tobacco. He reports current alcohol use. He reports that he does not use drugs.      Physical Exam: BP 121/82   Pulse 71   Ht 5' 9 (1.753 m)   Wt 206 lb 12.8 oz (93.8 kg)   BMI 30.54 kg/m    Constitutional:  Alert and oriented, No acute distress. Cardiovascular: No clubbing, cyanosis, or edema. Respiratory: Normal respiratory effort, no increased work of breathing. GI: Nondistended GU: Right testicle smaller 14 cc, right testicle sits in high scrotum (patient reports since his vasectomy several years ago).  Left testicle normal 18 cc.  No masses, nontender Skin: No rashes, bruises or suspicious lesions. Neurologic: Grossly intact, no focal deficits, moving all 4 extremities. Psychiatric: Normal mood and affect.  Laboratory Data: Component Ref Range & Units (hover) 2 mo ago (08/29/23) 1 yr ago (07/13/22) 1 yr ago (04/06/22) 11 yr ago (02/23/12)  Testosterone  275.81 Low  454.62 278.21 Low  186 R   Component Ref Range & Units (hover) 2 mo ago 1 yr ago 8 yr ago 11 yr ago  PSA 0.50 0.64 CM 0.39 R 0.59 R    Pertinent Imaging: N/A    Assessment & Plan:    Testosterone  deficiency in male Assessment & Plan: Intermittent TRT since age 45-40s Marginal to modest symptomatic improvement, although inconsistent use + Symptom screen: Significant daytime fatigue, decreased libido, mood instability, poor sleep Stopped TRT in 2024 Total T = 275 (Aug 2025)   Discussed the basic tenets of low testosterone  or male testosterone  deficiency, including proper diagnosis with clinical signs and objectively low total T <300 on two separate measurements.  Reviewed conservative strategies first -- optimization of weight,  exercise, nutrition, sleep, and limiting EtOH/opioids. Discussed risks/benefits of TRT including infertility, erythrocytosis, CV/thrombotic risk, and prostate effects. Reviewed initial lab work with baseline Hgb/Hct, LFTs, and PSA, as well as serial draws for periodic monitoring.  Reviewed various TRT formulations including topical gels, IM injections, implantable pellets, and long-acting IM Aveed. Also, reviewed off-lab use of Clomid- an oral estrogen receptor modulator that can boost endogenous testosterone  production and avoid negative effects on spermiogenesis.  Shared decision-making re: trial of TRT vs continued conservative management.    - Confirmatory lab work: A.m. total T, LH/FSH, estradiol -Would be willing to restart TRT, may want to consider longer acting formulation (as he did not seem to tolerate wide therapeutic swings with injections) -consider Aveed or Testopel  -However, if non improvement in symptoms after consistent TRT-would document and discontinue further treatment  Orders: -     Testosterone ; Future -     FSH/LH; Future -     Estradiol      Penne Skye, MD 11/22/2023  [facility] [address] [place], [place] [address] [phone]

## 2023-11-15 NOTE — Assessment & Plan Note (Addendum)
 Intermittent TRT since age 54-40s Marginal to modest symptomatic improvement, although inconsistent use + Symptom screen: Significant daytime fatigue, decreased libido, mood instability, poor sleep Stopped TRT in 2024 Total T = 275 (Aug 2025)   Discussed the basic tenets of low testosterone  or male testosterone  deficiency, including proper diagnosis with clinical signs and objectively low total T <300 on two separate measurements.  Reviewed conservative strategies first -- optimization of weight, exercise, nutrition, sleep, and limiting EtOH/opioids. Discussed risks/benefits of TRT including infertility, erythrocytosis, CV/thrombotic risk, and prostate effects. Reviewed initial lab work with baseline Hgb/Hct, LFTs, and PSA, as well as serial draws for periodic monitoring.  Reviewed various TRT formulations including topical gels, IM injections, implantable pellets, and long-acting IM Aveed. Also, reviewed off-lab use of Clomid- an oral estrogen receptor modulator that can boost endogenous testosterone  production and avoid negative effects on spermiogenesis.  Shared decision-making re: trial of TRT vs continued conservative management.    - Confirmatory lab work: A.m. total T, LH/FSH, estradiol -Would be willing to restart TRT, may want to consider longer acting formulation (as he did not seem to tolerate wide therapeutic swings with injections) -consider Aveed or Testopel  -However, if non improvement in symptoms after consistent TRT-would document and discontinue further treatment

## 2023-11-22 ENCOUNTER — Encounter: Payer: Self-pay | Admitting: Urology

## 2023-11-22 ENCOUNTER — Other Ambulatory Visit: Payer: Self-pay

## 2023-11-22 ENCOUNTER — Ambulatory Visit: Admitting: Urology

## 2023-11-22 VITALS — BP 121/82 | HR 71 | Ht 69.0 in | Wt 206.8 lb

## 2023-11-22 DIAGNOSIS — E291 Testicular hypofunction: Secondary | ICD-10-CM

## 2023-11-23 LAB — FSH/LH
FSH: 7.3 m[IU]/mL (ref 1.5–12.4)
LH: 4.9 m[IU]/mL (ref 1.7–8.6)

## 2023-11-23 LAB — TESTOSTERONE: Testosterone: 567 ng/dL (ref 264–916)

## 2023-11-23 LAB — ESTRADIOL: Estradiol: 13.6 pg/mL (ref 7.6–42.6)

## 2023-12-03 NOTE — Progress Notes (Deleted)
 12/06/2023 9:58 PM   Adrian Moreno [birth year] [id number]  Referring provider: Wendee Lynwood HERO, NP [address] [place],  [place] [address]  Urological history: 1. Hypogonadism - testosterone  level - hemoglobin/hematocrit -     No chief complaint on file.   HPI: Adrian Moreno is a 53 y.o. man who presents today for follow up.   Previous records reviewed.  He has been experiencing reduced energy, reduced endurance, diminished work performance, diminished physical performance, loss of body hair, reduced beard growth, fatigue, reduced lean muscle mass and obesity.  He has also noticed depressive symptoms, cognitive dysfunction, reduced motivation, poor concentration, poor memory and irritability.  There is also been a decreased in his libido and issues with erectile dysfunction.  History of anabolic steroid use and a prolonged period sustained-release opioids   He reports good adherence to Clomid 50 mg, 1/2 tablet daily.  Denies new complaints of low libido, erectile dysfunction, fatigue, or mood changes.  No complaints of gynecomastia, visual changes, or thromboembolic symptoms.  Energy level, libido and overall sense of wellbeing being reported as stable/ improved compared to prior visit.    Testosterone  level (10/2023) 567  Hemoglobin/hematocrit ***  Liver enzymes ***   I PSS ***  He reports sensation of incomplete bladder emptying,   urinary frequency,   urinary intermittency,   urinary urgency,   a weak urinary stream,   having to strain to void,   nocturia x ***,   leaking before being able to reach the restroom,   leaking with coughing,   leaking without awareness,   and post void dribbling.     He is wearing *** pads//depends  daily.    Patient denies any modifying or aggravating factors.  Patient denies any recent UTI's, gross hematuria, dysuria or suprapubic/flank pain.  Patient denies any fevers, chills, nausea or vomiting.  ***  He has  a family history of PCa, colon cancer, ovarian cancer and/or breast cancer with ***.   He does not have a family history of PCa, colon cancer, ovarian cancer, and/or breast cancer .***     UA***  PVR***  PSA (08/2023) 0.50  Serum creatinine (08/2023) 0.83, eGFR 99.46  Hemoglobin A1c (08/2023) 5.9  Diuretics:  ***  BPH meds: ***  Fluid consumption: ***  SHIM ***  He does not have confidence that he could get and keep an erection, his erections are not firm enough for penetrative intercourse, he has difficulty maintaining his erections,  and he is not finding intercourse satisfactory for him.  ***  Patient still having spontaneous erections.  ***   He denies any pain or curvature with erections.    He is not able to ejaculate, has pain with ejaculation, and has blood in his ejaculate fluid.   ***  Testosterone  level (10/2023) 567  Cholesterol (05/2023) elevated triglycerides, elevated LDL   Hemoglobin A1c (08/2023) 5.9  TSH (08/2023) 1.04   Tried and failed ***  PMH: Past Medical History:  Diagnosis Date   BPH (benign prostatic hypertrophy)    Wrenn   Cancer [facility])    Basal cell on face   Hypogonadism in male 04/22/2014   OSA (obstructive sleep apnea) 01/23/2012   did not tolerate CPAP   Superficial vein thrombosis 01/23/2015   after achilles surgery    Surgical History: Past Surgical History:  Procedure Laterality Date   COLONOSCOPY  05/23/2014   int hem, rpt 10 yrs Kristen)   COLONOSCOPY WITH PROPOFOL  N/A 12/25/2022  Procedure: COLONOSCOPY WITH PROPOFOL ;  Surgeon: Therisa Bi, MD;  Location: [facility] ENDOSCOPY;  Service: Gastroenterology;  Laterality: N/A;   HERNIA REPAIR  2024   HIP SURGERY Left 01/23/2012   torn labrum   NASAL SEPTUM SURGERY  01/23/2012   s/p surgery x2 on same day with complications   TENOTOMY ACHILLES TENDON Bilateral     Home Medications:  Allergies as of 12/06/2023   No Known Allergies      Medication List        Accurate as  of December 03, 2023  9:58 PM. If you have any questions, ask your nurse or doctor.          tamsulosin  0.4 MG Caps capsule Commonly known as: FLOMAX  Take 2 capsules (0.8 mg total) by mouth daily.        Allergies: No Known Allergies  Family History: Family History  Problem Relation Age of Onset   Cancer Father 71       prostate (normal PSAs) deceased at 27   Hypertension Father    Diabetes Father    Heart failure Father    CAD Neg Hx    Stroke Neg Hx     Social History:  reports that he has never smoked. He has never been exposed to tobacco smoke. He has never used smokeless tobacco. He reports current alcohol use. He reports that he does not use drugs.  ROS: Pertinent ROS in HPI  Physical Exam: There were no vitals taken for this visit.  Constitutional:  Well nourished. Alert and oriented, No acute distress. HEENT: [place] AT, moist mucus membranes.  Trachea midline, no masses. Cardiovascular: No clubbing, cyanosis, or edema. Respiratory: Normal respiratory effort, no increased work of breathing. GI: Abdomen is soft, non tender, non distended, no abdominal masses. Liver and spleen not palpable.  No hernias appreciated.  Stool sample for occult testing is not indicated.   GU: No CVA tenderness.  No bladder fullness or masses.  Patient with circumcised/uncircumcised phallus. ***Foreskin easily retracted***  Urethral meatus is patent.  No penile discharge. No penile lesions or rashes. Scrotum without lesions, cysts, rashes and/or edema.  Testicles are located scrotally bilaterally. No masses are appreciated in the testicles. Left and right epididymis are normal. Rectal: Patient with  normal sphincter tone. Anus and perineum without scarring or rashes. No rectal masses are appreciated. Prostate is approximately *** grams, *** nodules are appreciated. Seminal vesicles are normal. Skin: No rashes, bruises or suspicious lesions. Lymph: No cervical or inguinal  adenopathy. Neurologic: Grossly intact, no focal deficits, moving all 4 extremities. Psychiatric: Normal mood and affect.  Laboratory Data: See Epic and HPI   I have reviewed the labs.   Pertinent Imaging: N/A  Assessment & Plan:  ***  1. Hypogonadism  -testosterone  levels are therapeutic -H & H normal -continue ***  2. BPH with LUTS -PSA stable -DRE benign -UA benign -PVR < 300 cc -symptoms - *** -most bothersome symptoms are *** -continue conservative management, avoiding bladder irritants and timed voiding's -Initiate alpha-blocker (***), discussed side effects *** -Initiate 5 alpha reductase inhibitor (***), discussed side effects *** -Continue tamsulosin  0.4 mg daily, alfuzosin 10 mg daily, Rapaflo 8 mg daily, terazosin, doxazosin, Cialis 5 mg daily and finasteride 5 mg daily, dutasteride 0.5 mg daily***:refills given -Cannot tolerate medication or medication failure, schedule cystoscopy ***  3. Erectile dysfunction:    - I explained that conditions like diabetes, hypertension, coronary artery disease, peripheral vascular disease, smoking, alcohol consumption, age, sleep apnea and BPH  can diminish the ability to have an erection - I explained the ED may be a risk marker for underlying CVD and he should follow up with PCP for further studies *** - A recent study published in Sex Med 2018 Apr 13 revealed moderate to vigorous aerobic exercise for 40 minutes 4 times per week can decrease erectile problems caused by physical inactivity, obesity, hypertension, metabolic syndrome and/or cardiovascular diseases *** - We discussed trying a *** different PDE5 inhibitor, intra-urethral suppositories, intracavernous vasoactive drug injection therapy, vacuum erection devices and penile prosthesis implantation         No follow-ups on file.  These notes generated with voice recognition software. I apologize for typographical errors.  CLOTILDA HELON RIGGERS  [facility] [address]  [address] [place], [place] [address] [phone]

## 2023-12-06 ENCOUNTER — Ambulatory Visit: Admitting: Urology

## 2023-12-06 DIAGNOSIS — N529 Male erectile dysfunction, unspecified: Secondary | ICD-10-CM

## 2023-12-06 DIAGNOSIS — N401 Enlarged prostate with lower urinary tract symptoms: Secondary | ICD-10-CM

## 2023-12-06 DIAGNOSIS — E291 Testicular hypofunction: Secondary | ICD-10-CM

## 2024-01-28 ENCOUNTER — Encounter: Payer: Self-pay | Admitting: Nurse Practitioner

## 2024-01-30 NOTE — Telephone Encounter (Signed)
 Please schedule an office visit.

## 2024-02-26 ENCOUNTER — Ambulatory Visit: Admitting: Nurse Practitioner

## 2024-02-26 VITALS — BP 118/78 | HR 64 | Temp 98.1°F | Ht 69.0 in | Wt 202.8 lb

## 2024-02-26 DIAGNOSIS — M6289 Other specified disorders of muscle: Secondary | ICD-10-CM | POA: Diagnosis not present

## 2024-02-26 DIAGNOSIS — M7662 Achilles tendinitis, left leg: Secondary | ICD-10-CM

## 2024-02-26 MED ORDER — CYCLOBENZAPRINE HCL 5 MG PO TABS: 5.0000 mg | ORAL_TABLET | Freq: Two times a day (BID) | ORAL | 0 refills | Status: AC | PRN

## 2024-02-26 NOTE — Progress Notes (Signed)
 "  Established Patient Office Visit  Subjective   Patient ID: Adrian Moreno, male    DOB: 11/18/1970  Age: 54 y.o. MRN: [id number]  Chief Complaint  Patient presents with   Back Pain    Pt complains lower right side back pain. States of having a knot.    Foot Pain    Tendinitis in left heel that started a few months ago.     Discussed the use of AI scribe software for clinical note transcription with the patient, who gave verbal consent to proceed.  History of Present Illness Adrian Moreno is a 54 year old male who presents with left foot tendinitis and lower back pain.  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 He has a history of surgery on both feet for similar issues. The pain was initially severe but has improved with self-care measures such as icing, heating, and modifying his walking pattern. He uses orthotics that are now worn out and seeks a new pair. He avoids anti-inflammatory medications due to stomach issues but has used ibuprofen  occasionally with some relief. The pain is primarily present in the morning or after sitting for a while, and stretching helps alleviate it.  He also reports mid right back pain that has been present for two months. The pain is described as a deep, dull ache, resembling a 'super knotted up muscle.' It is consistent but can be exacerbated by certain movements. He suspects it may be related to his work activities, such as lifting a laser arm or using a computer mouse. His wife has attempted to relieve the pain through massage, which sometimes provides relief. He has been using a heat pad but is unsure of its effectiveness. No numbness, tingling, or changes in bowel or bladder function.  He mentions a concern about night sweats, as his wife has noticed yellow stains on the sheets for years. However, he does not wake up feeling sweaty or hot and does not experience  soaking night sweats. He sleeps without a shirt, and his work shirts also show similar staining.     Review of Systems  Constitutional:  Negative for chills and fever.  Respiratory:  Negative for shortness of breath.   Cardiovascular:  Negative for chest pain.  Musculoskeletal:  Positive for back pain and joint pain.  Neurological:  Negative for tingling and weakness.      Objective:     BP 118/78   Pulse 64   Temp 98.1 F (36.7 C) (Oral)   Ht 5' 9 (1.753 m)   Wt 202 lb 12.8 oz (92 kg)   SpO2 98%   BMI 29.95 kg/m  BP Readings from Last 3 Encounters:  02/26/24 118/78  11/22/23 121/82  09/26/23 126/70   Wt Readings from Last 3 Encounters:  02/26/24 202 lb 12.8 oz (92 kg)  11/22/23 206 lb 12.8 oz (93.8 kg)  09/26/23 209 lb 12.8 oz (95.2 kg)   SpO2 Readings from Last 3 Encounters:  02/26/24 98%  09/26/23 97%  08/29/23 95%      Physical Exam Vitals and nursing note reviewed.  Constitutional:      Appearance: Normal appearance.  Cardiovascular:     Rate and Rhythm: Normal rate and regular rhythm.     Heart sounds: Normal heart sounds.  Pulmonary:     Effort: Pulmonary effort is normal.     Breath sounds: Normal breath sounds.  Musculoskeletal:        General: Tenderness present.     Thoracic back: Tenderness present. No bony tenderness.       Back:       Feet:     Comments: Exacerbated with right lateral lumbar flexion and rotation   Neurological:     Mental Status: He is alert.      No results found for any visits on 02/26/24.    The 10-year ASCVD risk score (Arnett DK, et al., 2019) is: 4.4%    Assessment & Plan:   Problem List Items Addressed This Visit   None Visit Diagnoses       Achilles tendinitis of left lower extremity    -  Primary   Relevant Orders   Ambulatory referral to Podiatry     Muscle tightness       Relevant Medications   cyclobenzaprine  (FLEXERIL ) 5 MG tablet      Assessment and Plan Assessment & Plan Achilles  tendinitis of left lower extremity Chronic tendinitis exacerbated by recent activity. Orthotics worn out. Prefers to avoid NSAIDs. - Referred to podiatry in [facility] for evaluation and new orthotics. - Advised continuation of stretching exercises and gym activities.  Myofascial low back pain  pain due to muscle tension, no neurological symptoms. Massage and heat provide relief. - Prescribed cyclobenzaprine  (Flexeril ) before bed. - Advised continuation of massage therapy and heat application. - Recommended considering physical therapy if symptoms persist or recur.   Return in about 6 months (around 08/25/2024) for CPE and Labs.    Adina Crandall, NP  "
